# Patient Record
Sex: Female | Born: 1970 | Race: White | Hispanic: No | Marital: Single | Smoking: Never smoker
Health system: Southern US, Community
[De-identification: ages and names within clinical notes are randomized; demographics above are authoritative.]

## PROBLEM LIST (undated history)

## (undated) DIAGNOSIS — F649 Gender identity disorder, unspecified: Secondary | ICD-10-CM

## (undated) DIAGNOSIS — E079 Disorder of thyroid, unspecified: Secondary | ICD-10-CM

## (undated) DIAGNOSIS — J45909 Unspecified asthma, uncomplicated: Secondary | ICD-10-CM

## (undated) DIAGNOSIS — F329 Major depressive disorder, single episode, unspecified: Secondary | ICD-10-CM

## (undated) DIAGNOSIS — F32A Depression, unspecified: Secondary | ICD-10-CM

## (undated) HISTORY — DX: Depression, unspecified: F32.A

## (undated) HISTORY — PX: TONSILLECTOMY: SUR1361

## (undated) HISTORY — DX: Major depressive disorder, single episode, unspecified: F32.9

---

## 2014-06-29 ENCOUNTER — Encounter (HOSPITAL_BASED_OUTPATIENT_CLINIC_OR_DEPARTMENT_OTHER): Payer: Self-pay | Admitting: Emergency Medicine

## 2014-06-29 ENCOUNTER — Emergency Department (HOSPITAL_BASED_OUTPATIENT_CLINIC_OR_DEPARTMENT_OTHER): Payer: No Typology Code available for payment source

## 2014-06-29 ENCOUNTER — Emergency Department (HOSPITAL_BASED_OUTPATIENT_CLINIC_OR_DEPARTMENT_OTHER)
Admission: EM | Admit: 2014-06-29 | Discharge: 2014-06-29 | Disposition: A | Discharge status: Home / Self Care | Payer: No Typology Code available for payment source | Attending: Emergency Medicine | Admitting: Emergency Medicine

## 2014-06-29 DIAGNOSIS — J45909 Unspecified asthma, uncomplicated: Secondary | ICD-10-CM | POA: Diagnosis not present

## 2014-06-29 DIAGNOSIS — S39011A Strain of muscle, fascia and tendon of abdomen, initial encounter: Secondary | ICD-10-CM | POA: Insufficient documentation

## 2014-06-29 DIAGNOSIS — Z8659 Personal history of other mental and behavioral disorders: Secondary | ICD-10-CM | POA: Insufficient documentation

## 2014-06-29 DIAGNOSIS — Y939 Activity, unspecified: Secondary | ICD-10-CM | POA: Diagnosis not present

## 2014-06-29 DIAGNOSIS — X58XXXA Exposure to other specified factors, initial encounter: Secondary | ICD-10-CM | POA: Diagnosis not present

## 2014-06-29 DIAGNOSIS — Z79899 Other long term (current) drug therapy: Secondary | ICD-10-CM | POA: Insufficient documentation

## 2014-06-29 DIAGNOSIS — E079 Disorder of thyroid, unspecified: Secondary | ICD-10-CM | POA: Insufficient documentation

## 2014-06-29 DIAGNOSIS — N50819 Testicular pain, unspecified: Secondary | ICD-10-CM

## 2014-06-29 DIAGNOSIS — Y929 Unspecified place or not applicable: Secondary | ICD-10-CM | POA: Diagnosis not present

## 2014-06-29 DIAGNOSIS — R102 Pelvic and perineal pain: Secondary | ICD-10-CM | POA: Diagnosis present

## 2014-06-29 HISTORY — DX: Unspecified asthma, uncomplicated: J45.909

## 2014-06-29 HISTORY — DX: Gender identity disorder, unspecified: F64.9

## 2014-06-29 HISTORY — DX: Disorder of thyroid, unspecified: E07.9

## 2014-06-29 LAB — URINALYSIS, ROUTINE W REFLEX MICROSCOPIC
Bilirubin Urine: NEGATIVE
Glucose, UA: NEGATIVE mg/dL
Hgb urine dipstick: NEGATIVE
Ketones, ur: NEGATIVE mg/dL
Leukocytes, UA: NEGATIVE
Nitrite: NEGATIVE
Protein, ur: NEGATIVE mg/dL
Specific Gravity, Urine: 1.025 (ref 1.005–1.030)
Urobilinogen, UA: 0.2 mg/dL (ref 0.0–1.0)
pH: 6 (ref 5.0–8.0)

## 2014-06-29 MED ORDER — HYDROCODONE-ACETAMINOPHEN 5-325 MG PO TABS: 1.0000 | ORAL_TABLET | ORAL | Status: DC | PRN

## 2014-06-29 MED ORDER — CIPROFLOXACIN HCL 500 MG PO TABS: 500.0000 mg | ORAL_TABLET | Freq: Two times a day (BID) | ORAL | Status: DC

## 2014-06-29 NOTE — Discharge Instructions (Signed)
Groin Strain A groin strain (also called a groin pull) is an injury to the muscles or tendon on the upper inner part of the thigh. These muscles are called the adductor muscles or groin muscles. They are responsible for moving the leg across the body. A muscle strain occurs when a muscle is overstretched and some muscle fibers are torn. A groin strain can range from mild to severe depending on how many muscle fibers are affected and whether the muscle fibers are partially or completely torn.  Groin strains usually occur during exercise or participation in sports. The injury often happens when a sudden, violent force is placed on a muscle, stretching the muscle too far. A strain is more likely to occur when your muscles are not warmed up or if you are not properly conditioned. Depending on the severity of the groin strain, recovery time may vary from a few weeks to several weeks. Severe injuries often require 4-6 weeks for recovery. In these cases, complete healing can take 4-5 months.  CAUSES   Stretching the groin muscles too far or too suddenly, often during side-to-side motion with an abrupt change in direction.  Putting repeated stress on the groin muscles over a long period of time.  Performing vigorous activity without properly stretching the groin muscles beforehand. SYMPTOMS   Pain and tenderness in the groin area. This begins as sharp pain and persists as a dull ache.  Popping or snapping feeling when the injury occurs (for severe strains).  Swelling or bruising.  Muscle spasms.  Weakness in the leg.  Stiffness in the groin area with decreased ability to move the affected muscles. DIAGNOSIS  Your caregiver will perform a physical exam to diagnose a groin strain. You will be asked about your symptoms and how the injury occurred. X-rays are sometimes needed to rule out a broken bone or cartilage problems. Your caregiver may order a CT scan or MRI if a complete muscle tear is  suspected. TREATMENT  A groin strain will often heal on its own. Your caregiver may prescribe medicines to help manage pain and swelling (anti-inflammatory medicine). You may be told to use crutches for the first few days to minimize your pain. HOME CARE INSTRUCTIONS   Rest. Do not use the strained muscle if it causes pain.  Put ice on the injured area.  Put ice in a plastic bag.  Place a towel between your skin and the bag.  Leave the ice on for 15-20 minutes, every 2-3 hours. Do this for the first 2 days after the injury.  Only take over-the-counter or prescription medicines as directed by your caregiver.  Wrap the injured area with an elastic bandage as directed by your caregiver.  Keep the injured leg raised (elevated).  Walk, stretch, and perform range-of-motion exercises to improve blood flow to the injured area. Only perform these activities if you can do so without any pain. To prevent muscle strains:  Warm up before exercise.  Develop proper conditioning and strength in the groin muscles. SEEK IMMEDIATE MEDICAL CARE IF:   You have increased pain or swelling in the affected area.   Your symptoms are not improving or are getting worse. MAKE SURE YOU:   Understand these instructions.  Will watch your condition.  Will get help right away if you are not doing well or get worse. Document Released: 04/11/2004 Document Revised: 07/31/2012 Document Reviewed: 04/17/2012 [facility] Patient Information 2015 [place], [place]. This information is not intended to replace advice given to you  by your health care provider. Make sure you discuss any questions you have with your health care provider.

## 2014-06-29 NOTE — ED Notes (Signed)
Pt started feeling penile/ perineal pain at 1pm today.  "It felt like I was sitting on something."  Pain is getting worse throughout the day.  Pt is in NAD at this time. No nonverbal signs of pain.  Walking normally. Denies any actual testicular pain.  Sts UC sent pt here for [place].

## 2014-06-29 NOTE — ED Provider Notes (Signed)
CSN: [id number]     Arrival date & time 06/29/14  1954 History   This chart was scribed for Jade Richardson, by Tula Nakayama, ED Scribe. This patient was seen in room MH11/MH11 and the patient's care was started at 8:40 PM.    Chief Complaint  Patient presents with  . Perineal pain    The history is provided by the patient. No language interpreter was used.    HPI Comments: Jade Richardson is a 43 y.o. female who is currently undergoing a sex change and presents to the Emergency Department complaining of constant, gradually worsening testicular pain that started earlier today. Pt states pain is center, deep and related to muscles that hold back urine. She denies dysuria, difficulty urinating, hematuria and abdominal pain as associated symptoms.   Past Medical History  Diagnosis Date  . Gender dysphoria   . Thyroid disease   . Asthma    History reviewed. No pertinent past surgical history. No family history on file. History  Substance Use Topics  . Smoking status: Never Smoker   . Smokeless tobacco: Not on file  . Alcohol Use: Yes     Comment: rarely   OB History    No data available     Review of Systems  Gastrointestinal: Negative for abdominal pain.  Genitourinary: Negative for dysuria, hematuria and difficulty urinating.       Deep, centered testicular pain  Musculoskeletal: Positive for myalgias.  All other systems reviewed and are negative.     Allergies  Review of patient's allergies indicates no known allergies.  Home Medications   Prior to Admission medications   Medication Sig Start Date End Date Taking? Authorizing Provider  estradiol (ESTRACE) 2 MG tablet Take 2 mg by mouth 2 (two) times daily.   Yes Historical Provider, MD  levothyroxine (SYNTHROID, LEVOTHROID) 150 MCG tablet Take 150 mcg by mouth daily before breakfast.   Yes Historical Provider, MD  spironolactone (ALDACTONE) 25 MG tablet Take 100 mg by mouth 2 (two) times daily.   Yes  Historical Provider, MD   BP 123/87 mmHg  Pulse 91  Temp(Src) 98.1 F (36.7 C) (Oral)  Resp 16  Ht 5' 7"  (1.702 m)  Wt 202 lb (91.627 kg)  BMI 31.63 kg/m2  SpO2 99% Physical Exam  Constitutional: She is oriented to person, place, and time. She appears well-developed and well-nourished. No distress.  HENT:  Head: Normocephalic and atraumatic.  Right Ear: Hearing normal.  Left Ear: Hearing normal.  Nose: Nose normal.  Mouth/Throat: Oropharynx is clear and moist and mucous membranes are normal.  Eyes: Conjunctivae and EOM are normal. Pupils are equal, round, and reactive to light.  Neck: Normal range of motion. Neck supple.  Cardiovascular: Regular rhythm, S1 normal and S2 normal.  Exam reveals no gallop and no friction rub.   No murmur heard. Pulmonary/Chest: Effort normal and breath sounds normal. No respiratory distress. She exhibits no tenderness.  Abdominal: Soft. Normal appearance and bowel sounds are normal. There is no hepatosplenomegaly. There is no tenderness. There is no rebound, no guarding, no tenderness at McBurney's point and negative Murphy's sign. No hernia.  Musculoskeletal: Normal range of motion.  Neurological: She is alert and oriented to person, place, and time. She has normal strength. No cranial nerve deficit or sensory deficit. Coordination normal. GCS eye subscore is 4. GCS verbal subscore is 5. GCS motor subscore is 6.  Skin: Skin is warm, dry and intact. No rash noted. No cyanosis.  Psychiatric: She  has a normal mood and affect. Her speech is normal and behavior is normal. Thought content normal.  Nursing note and vitals reviewed.   ED Course  Procedures (including critical care time) DIAGNOSTIC STUDIES: Oxygen Saturation is 99% on RA, normal by my interpretation.    COORDINATION OF CARE: 8:41 PM Discussed treatment plan with pt at bedside and pt agreed to plan.  Labs Review Labs Reviewed  URINALYSIS, ROUTINE W REFLEX MICROSCOPIC    Imaging  Review No results found.   EKG Interpretation None      MDM   Final diagnoses:  Testicle pain    Patient presents to the ER her evaluation of pain in the low abdomen and groin area. Patient reports that he has had some pain with movement. He was referred to the ER to rule out testicular torsion. This was performed and was negative. I also performed a rectal exam, he does not have an enlarged or tender prostate. Urinalysis was unremarkable.  Symptoms most likely secondary to abdominal strain. Treat with analgesia. Empiric cipro.   I personally performed the services described in this documentation, which was scribed in my presence. The recorded information has been reviewed and is accurate.      Jade Greek, MD 06/29/14 2322

## 2015-12-12 IMAGING — US US ART/VEN ABD/PELV/SCROTUM DOPPLER LTD
1 series · 13 of 25 positions shown · non-contrast
Comparison: None.

CLINICAL DATA: Pain in scrotum for 9 hr. Only hurts with full
bladder.

EXAM:
DOPPLER ULTRASOUND OF THE TESTICLES
TECHNIQUE: Color and spectral Doppler ultrasound were utilized to evaluate
blood flow to the testicles.

[Series 1: us art/ven abd/pelv/scrotum doppler ltd · 0.07mm/px · 13 of 29 slices shown]
[im 1/29]
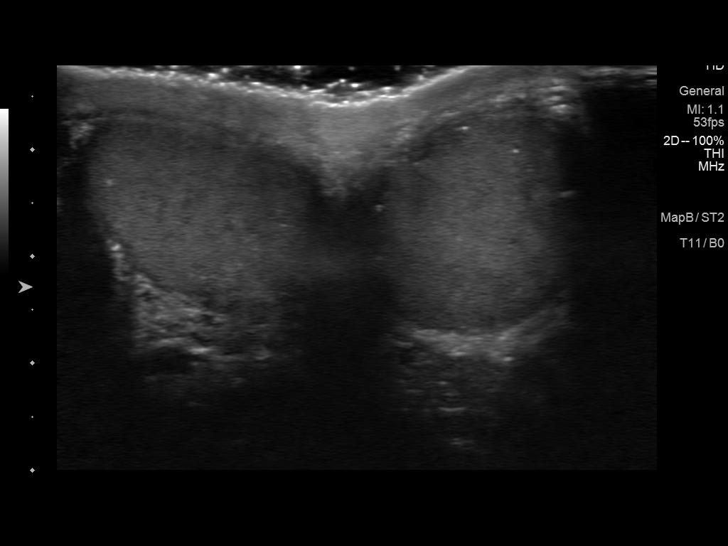
[im 3/29]
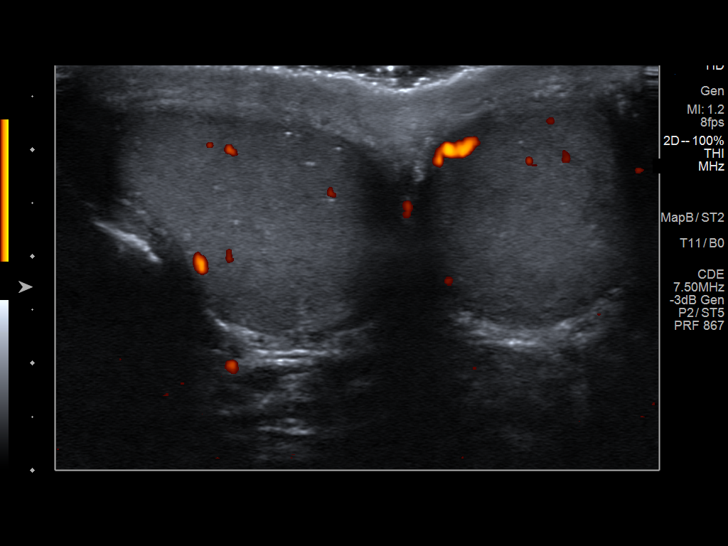
[im 5/29]
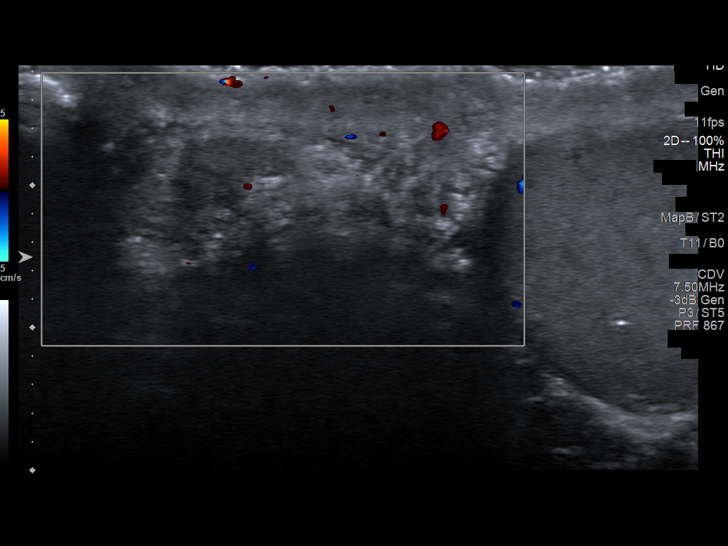
[im 8/29]
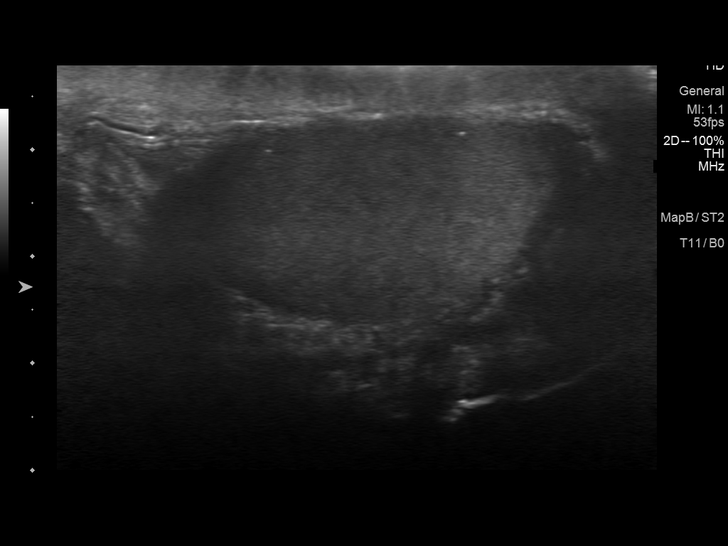
[im 10/29]
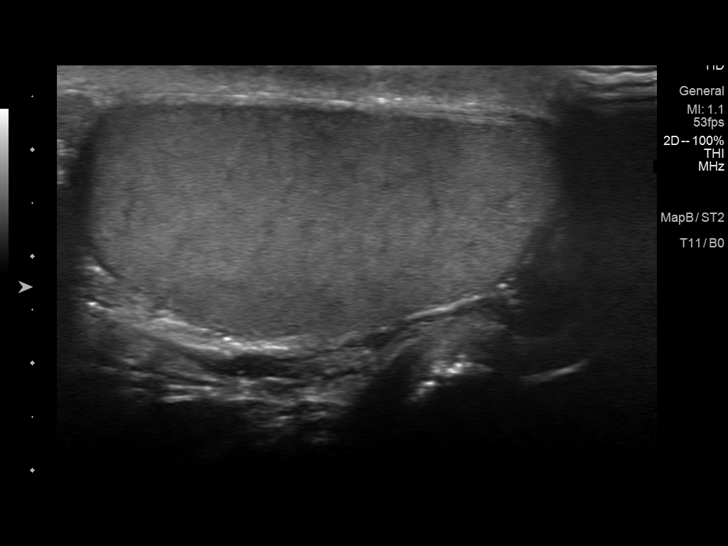
[im 12/29]
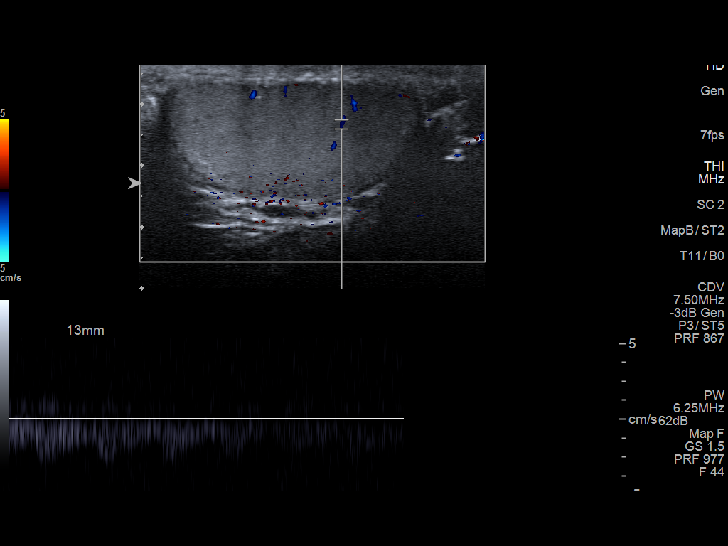
[im 15/29]
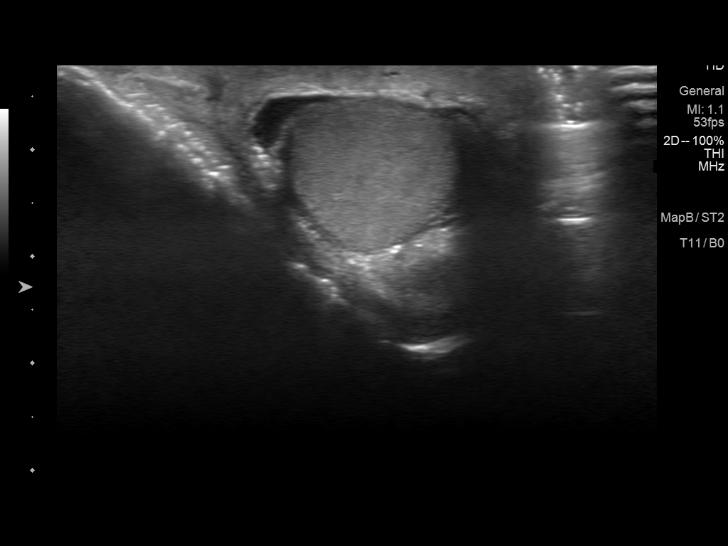
[im 17/29]
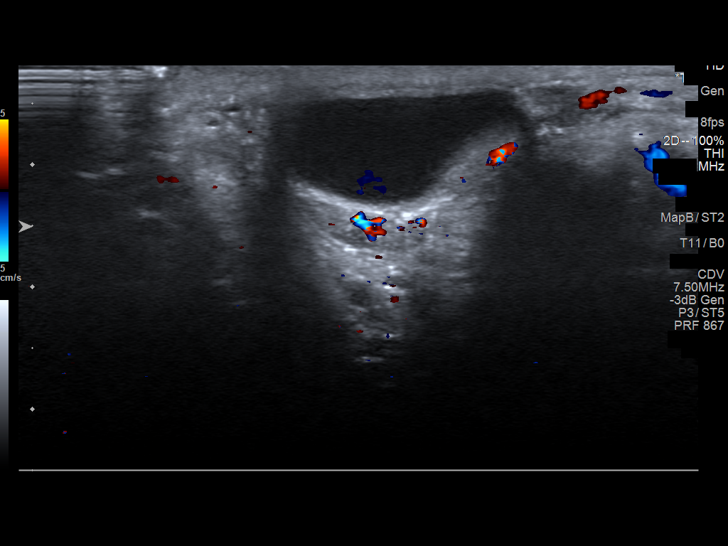
[im 19/29]
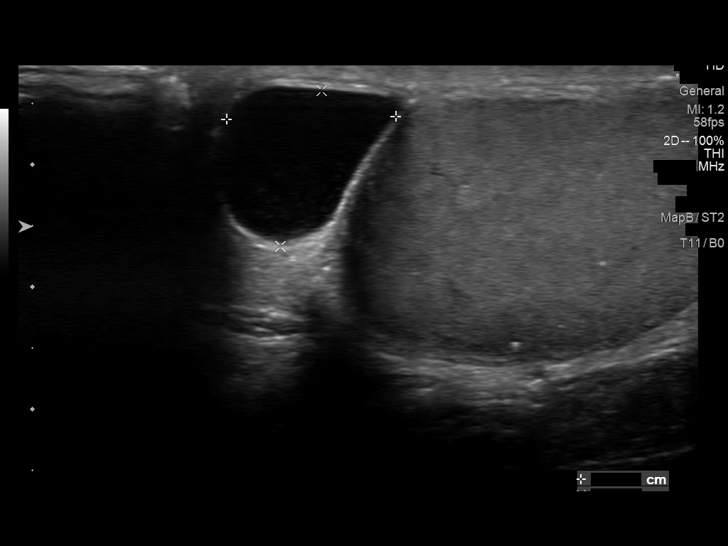
[im 22/29]
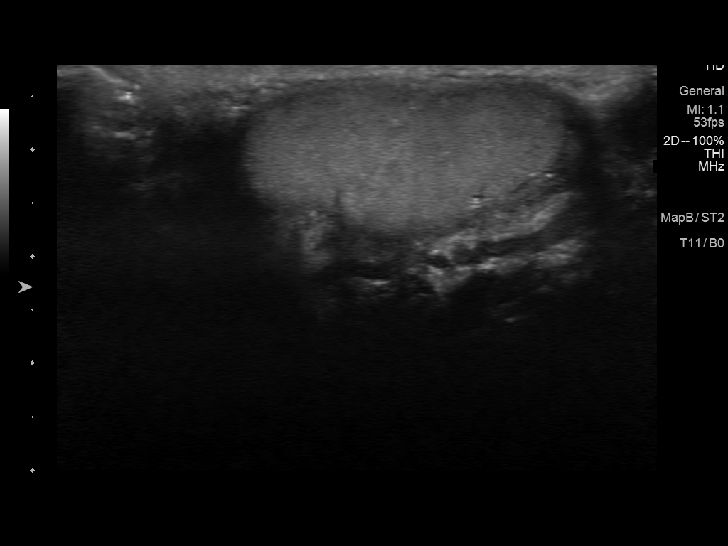
[im 24/29]
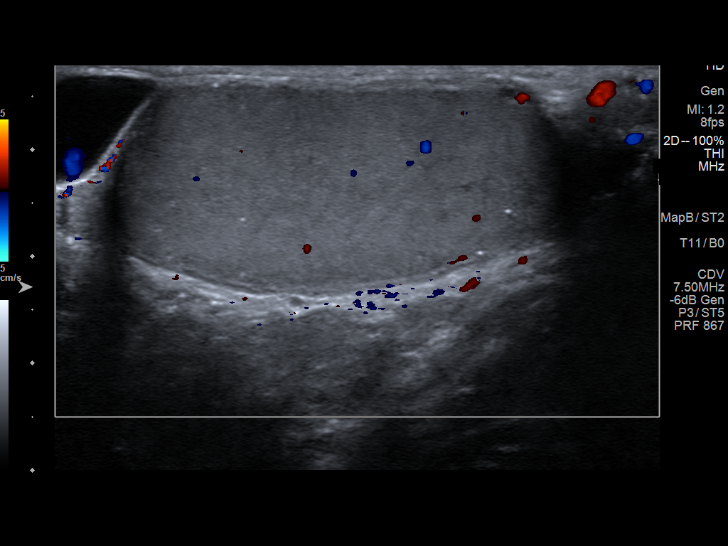
[im 26/29]
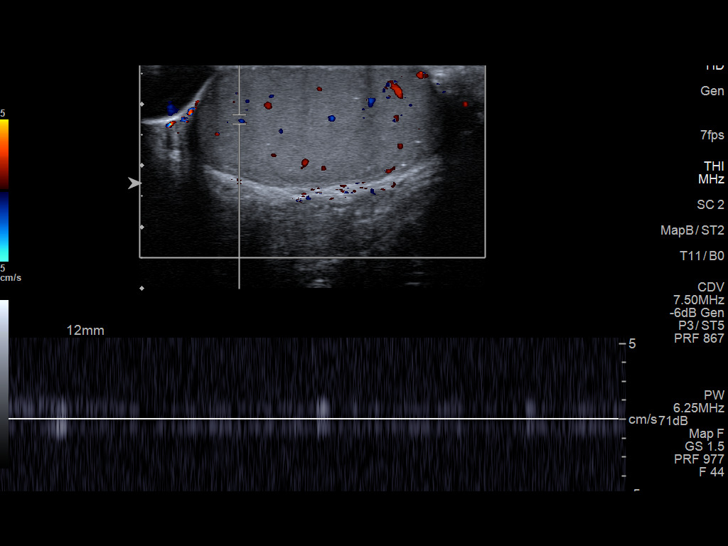
[im 29/29]
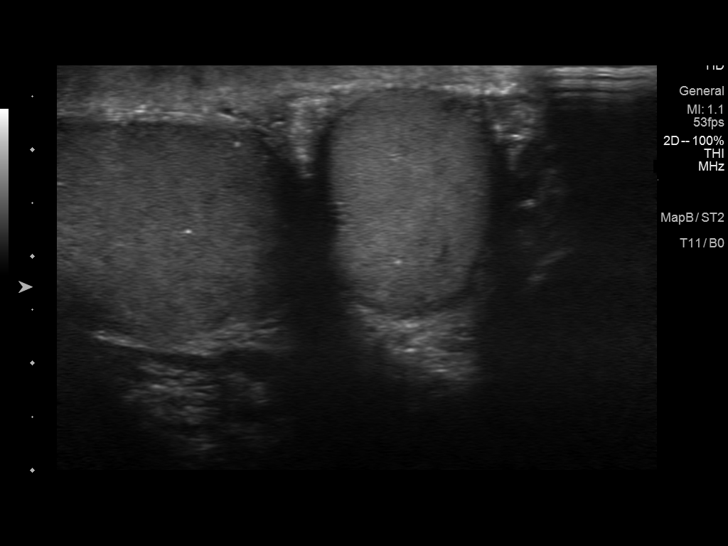

[13 of 25 positions shown; findings below may reference images not displayed]

FINDINGS: Right testicle

Measurements: 4.6 x 2.3 x 2.5 cm. Normal homogeneous parenchymal
echotexture. Multiple tiny echogenic foci scattered throughout the
testicle consistent with microlithiasis. No mass lesions identified.
Homogeneous flow on color flow Doppler imaging.

Left testicle

Measurements: 4.3 x 2 x 2.5 cm. Normal homogeneous parenchymal
echotexture. Multiple tiny echogenic foci scattered throughout the
testicle consistent with microlithiasis. No mass lesions identified.
Homogeneous flow on color flow Doppler imaging.

Right epididymis:  Normal in size and appearance.

Left epididymis: Epididymal cyst or spermatocele measuring 1.4 x
x 1.3 cm

Pulsed Doppler interrogation of both testes demonstrates normal
low-resistance arterial and venous waveforms. Normal homogeneous
flow demonstrated on color flow Doppler throughout both testes and
epididymides.
IMPRESSION: No evidence of testicular mass or torsion. Bilateral testicular
microlithiasis. Left epididymal cyst or spermatocele.

## 2016-09-06 LAB — HEPATIC FUNCTION PANEL
ALT: 14 (ref 7–35)
AST: 14 (ref 13–35)
Alkaline Phosphatase: 46 (ref 25–125)
Bilirubin, Total: 0.2

## 2016-09-06 LAB — BASIC METABOLIC PANEL
BUN: 17 (ref 4–21)
Creatinine: 0.7 (ref 0.5–1.1)
Glucose: 105
Potassium: 4.6 (ref 3.4–5.3)
Sodium: 140 (ref 137–147)

## 2016-09-06 LAB — TSH: TSH: 3.17 (ref 0.41–5.90)

## 2017-02-16 ENCOUNTER — Encounter: Payer: Self-pay | Admitting: Family Medicine

## 2017-02-16 ENCOUNTER — Ambulatory Visit (INDEPENDENT_AMBULATORY_CARE_PROVIDER_SITE_OTHER): Payer: Managed Care, Other (non HMO) | Admitting: Family Medicine

## 2017-02-16 VITALS — BP 120/80 | HR 82 | Resp 12 | Ht 67.0 in | Wt 181.0 lb

## 2017-02-16 DIAGNOSIS — Z23 Encounter for immunization: Secondary | ICD-10-CM

## 2017-02-16 DIAGNOSIS — E039 Hypothyroidism, unspecified: Secondary | ICD-10-CM | POA: Diagnosis not present

## 2017-02-16 DIAGNOSIS — R634 Abnormal weight loss: Secondary | ICD-10-CM

## 2017-02-16 DIAGNOSIS — Z114 Encounter for screening for human immunodeficiency virus [HIV]: Secondary | ICD-10-CM | POA: Diagnosis not present

## 2017-02-16 LAB — BASIC METABOLIC PANEL
BUN: 14 mg/dL (ref 6–23)
CO2: 26 mEq/L (ref 19–32)
Calcium: 9.6 mg/dL (ref 8.4–10.5)
Chloride: 105 mEq/L (ref 96–112)
Creatinine, Ser: 0.92 mg/dL (ref 0.40–1.20)
GFR: 70.54 mL/min (ref >60.00)
Glucose, Bld: 95 mg/dL (ref 70–99)
Potassium: 4.5 mEq/L (ref 3.5–5.1)
Sodium: 137 mEq/L (ref 135–145)

## 2017-02-16 LAB — CBC WITH DIFFERENTIAL/PLATELET
Basophils Absolute: 0 10*3/uL (ref 0.0–0.1)
Basophils Relative: 0.7 % (ref 0.0–3.0)
Eosinophils Absolute: 0.1 10*3/uL (ref 0.0–0.7)
Eosinophils Relative: 2 % (ref 0.0–5.0)
HCT: 41.3 % (ref 36.0–46.0)
Hemoglobin: 13.9 g/dL (ref 12.0–15.0)
Lymphocytes Relative: 25.6 % (ref 12.0–46.0)
Lymphs Abs: 1.4 10*3/uL (ref 0.7–4.0)
MCHC: 33.7 g/dL (ref 30.0–36.0)
MCV: 90.9 fl (ref 78.0–100.0)
Monocytes Absolute: 0.7 10*3/uL (ref 0.1–1.0)
Monocytes Relative: 11.7 % (ref 3.0–12.0)
Neutro Abs: 3.4 10*3/uL (ref 1.4–7.7)
Neutrophils Relative %: 60 % (ref 43.0–77.0)
Platelets: 329 10*3/uL (ref 150.0–400.0)
RBC: 4.54 Mil/uL (ref 3.87–5.11)
RDW: 13.6 % (ref 11.5–15.5)
WBC: 5.7 10*3/uL (ref 4.0–10.5)

## 2017-02-16 LAB — HEMOGLOBIN A1C: Hgb A1c MFr Bld: 5.4 % (ref 4.6–6.5)

## 2017-02-16 LAB — TSH: TSH: 3.86 u[IU]/mL (ref 0.35–4.50)

## 2017-02-16 NOTE — Progress Notes (Signed)
HPI:   Jade Richardson is a 46 y.o. female, who is here today to establish care.  Former PCP: N/A Last preventive routine visit: She follows with ger gyn every 6 months, last visit ago. She is hormonal therapy , part of transgender treatment.   Chronic medical problems: Neuro sensorial hearing loss (ENT in 01/2017), hypothyroidism, and depression among some.  She is currently on Levothyroxine 150 mcg daily. She denies diarrhea/constipation,tremors,cold/heat intolerance.  Concerns today:   She is concerned about weight loss.  She is not weighing herself at home but comparing wt in different offices during [place], so she is not sure about amount of pounds she has lost. Her clothes is loose and has decreased one size. She states that for the past few months she has change her diet and has been more active. She has decreased sodas intake and exercising regularly.She assumes these changes may have caused wt loss and she is happy about this but she she wants to be sure it is nothing to be worried about.  Reports Hx of scoliosis. She is staying with her girlfriend for the past month, sleeping on a softer mattress. Having mild achy lower back, usually in the morning when she first gets up. Resolves spontaneously, it doe snot affect her daily activities.  Denies tobacco use but her ex wife was smoker.   Denies abdominal pain, nausea, vomiting, changes in bowel habits, blood in stool or melena.  She is also concerned about STD's risk factors. Specifically she wants to know if oral sex and swallowing semen can increase risk of STD's. She denies anal intercourse. She states that she can have a full erection and so is her girlfriend.  She is not sure about last HIV test.   Review of Systems  Constitutional: Negative for activity change, appetite change, fatigue and fever.  HENT: Negative for mouth sores, nosebleeds, sore throat and trouble swallowing.   Eyes: Negative for redness  and visual disturbance.  Respiratory: Negative for shortness of breath and wheezing.   Cardiovascular: Negative for chest pain, palpitations and leg swelling.  Gastrointestinal: Negative for abdominal pain, blood in stool, nausea and vomiting.       Negative for changes in bowel habits.  Endocrine: Positive for polydipsia. Negative for cold intolerance, heat intolerance, polyphagia and polyuria.  Genitourinary: Negative for decreased urine volume, difficulty urinating, dysuria and hematuria.  Musculoskeletal: Positive for back pain. Negative for gait problem and neck pain.  Skin: Negative for color change and rash.  Allergic/Immunologic: Positive for environmental allergies.  Neurological: Negative for syncope, weakness, numbness and headaches.  Hematological: Negative for adenopathy. Does not bruise/bleed easily.  Psychiatric/Behavioral: Negative for confusion and sleep disturbance. The patient is nervous/anxious.       Current Outpatient Prescriptions on File Prior to Visit  Medication Sig Dispense Refill  . estradiol (ESTRACE) 2 MG tablet Take 2 mg by mouth 2 (two) times daily.    Marland Kitchen levothyroxine (SYNTHROID, LEVOTHROID) 150 MCG tablet Take 150 mcg by mouth daily before breakfast.    . spironolactone (ALDACTONE) 25 MG tablet Take 100 mg by mouth 2 (two) times daily.     No current facility-administered medications on file prior to visit.      Past Medical History:  Diagnosis Date  . Asthma   . Depression   . Gender dysphoria   . Thyroid disease    No Known Allergies  Family History  Problem Relation Age of Onset  . Mental illness Maternal Uncle   .  Cancer Neg Hx   . Diabetes Neg Hx   . Heart disease Neg Hx   . Hypertension Neg Hx     Social History   Social History  . Marital status: Single    Spouse name: N/A  . Number of children: N/A  . Years of education: N/A   Social History Main Topics  . Smoking status: Never Smoker  . Smokeless tobacco: Never Used  .  Alcohol use Yes     Comment: rarely  . Drug use: No  . Sexual activity: Yes    Birth control/ protection: None   Other Topics Concern  . None   Social History Narrative  . None    Vitals:   02/16/17 0959  BP: 120/80  Pulse: 82  Resp: 12   O2 sat at RA 97%. Body mass index is 28.35 kg/m.   Physical Exam  Nursing note and vitals reviewed. Constitutional: She is oriented to person, place, and time. She appears well-developed. No distress.  HENT:  Head: Atraumatic.  Mouth/Throat: Oropharynx is clear and moist. Mucous membranes are dry.  Eyes: Conjunctivae and EOM are normal. Pupils are equal, round, and reactive to light.  Neck: No tracheal deviation present. No thyroid mass and no thyromegaly (palpable) present.  Cardiovascular: Normal rate and regular rhythm.   No murmur heard. Pulses:      Dorsalis pedis pulses are 2+ on the right side, and 2+ on the left side.  Respiratory: Effort normal and breath sounds normal. No respiratory distress.  GI: Soft. She exhibits no mass. There is no hepatomegaly. There is no tenderness.  Musculoskeletal: She exhibits no edema or tenderness.       Lumbar back: She exhibits no tenderness and no bony tenderness.  Lymphadenopathy:    She has no cervical adenopathy.  Neurological: She is alert and oriented to person, place, and time. She has normal strength. Gait normal.  Skin: Skin is warm. No rash noted. No erythema.  Psychiatric: Her mood appears anxious.  Well groomed, good eye contact.     ASSESSMENT AND PLAN:   Graelyn was seen today for establish care.  Diagnoses and all orders for this visit:  Weight loss  Most likely related to life style changes. Reassured, No findings that suggest a serious process today. Further recommendations will be given according to lab results. Recommend monitoring wt at home. F/U in 2 months.  -     Hemoglobin A1c -     Basic metabolic panel -     CBC with  Differential/Platelet  Screening for HIV (human immunodeficiency virus)  We discussed HIV prophylaxis treatment and indications, she denies having anal intercourse.  -     HIV antibody (with reflex)  Hypothyroidism, unspecified type  No changes in current management, will follow labs done today and will give further recommendations accordingly.  -     TSH  Need for Tdap vaccination -     Tdap vaccine greater than or equal to 7yo IM      Adalberto Metzgar G. [name], MD  [facility]. [place] office.

## 2017-02-16 NOTE — Patient Instructions (Signed)
A few things to remember from today's visit:   Screening for HIV (human immunodeficiency virus) - Plan: Hemoglobin A1c, HIV antibody (with reflex)  Weight loss - Plan: Hemoglobin X5T, Basic metabolic panel, CBC with Differential/Platelet  Hypothyroidism, unspecified type - Plan: TSH   We have ordered labs or studies at this visit.  It can take up to 1-2 weeks for results and processing. IF results require follow up or explanation, we will call you with instructions. Clinically stable results will be released to your [facility]. If you have not heard from [place] or cannot find your results in [facility] in 2 weeks please contact our office at [phone].  If you are not yet signed up for [facility], please consider signing up  Please be sure medication list is accurate. If a new problem present, please set up appointment sooner than planned today.

## 2017-02-17 LAB — HIV ANTIBODY (ROUTINE TESTING W REFLEX): HIV 1&2 Ab, 4th Generation: NONREACTIVE

## 2017-03-07 ENCOUNTER — Encounter: Payer: Self-pay | Admitting: Family Medicine

## 2017-04-11 ENCOUNTER — Ambulatory Visit: Payer: Managed Care, Other (non HMO) | Admitting: Family Medicine

## 2017-04-17 NOTE — Progress Notes (Signed)
HPI:  Chief Complaint  Patient presents with  . Anxiety   Jade Richardson is a 46 y.o. female, who is here today c/o anxiety issues.  She was seen on 02/16/17.  She has some anxiety before, mild , and has not needed pharmacologic treatment. She feels like for the past 1.5-2 months anxiety is getting worse. Hx of depression.   She is a transgender female, currently she is on hormonal therapy. She has ben in a stable relation with also a transgender female, she is younger (44 yo). They have been together for about a year.They work in same company but different shifts and different days off, so they do not have a lot of time to spend together. Also her employer does not know about their relation.  She was married before for 7 years with another transgender female.States that in prior relation she was the one who took care of every thing, her ex-wife depended almost 100% on her. 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 Situations at home that make feel like she may have done something wrong "but I know I have not."  States that she knows she loves her but she feels insecure and afraid of not been good enough for her.  She knows she is the one that has a problem, she does "not want to feel this way." She denies depressed mood or suicidal ideation or plan but sometimes that she would be better death so she does not have to deal with this feeling.   She has a counselor she sees weekly and according to pt, she was told she does not need medication  to help with anxiety.   Review of Systems  Constitutional: Positive for fatigue. Negative for activity change, appetite change and unexpected weight change.  HENT: Negative for mouth sores and trouble swallowing.   Respiratory: Negative for chest tightness, shortness of breath and wheezing.   Cardiovascular: Negative for palpitations and leg swelling.  Gastrointestinal: Negative for abdominal pain, diarrhea, nausea and vomiting.  Endocrine: Negative for cold intolerance and heat intolerance.  Musculoskeletal: Negative for gait problem and myalgias.  Neurological: Negative for dizziness, tremors, seizures and headaches.  Psychiatric/Behavioral: Negative for confusion, hallucinations, sleep disturbance and suicidal ideas. The patient is nervous/anxious.     Current Outpatient Prescriptions on File Prior to Visit  Medication Sig Dispense Refill  . estradiol (ESTRACE) 2 MG tablet Take 2 mg by mouth 2 (two) times daily.    Marland Kitchen levothyroxine (SYNTHROID, LEVOTHROID) 150 MCG tablet Take 150 mcg by mouth daily before breakfast.    . medroxyPROGESTERone (PROVERA) 10 MG tablet Take 10 mg by mouth daily.    Marland Kitchen spironolactone (ALDACTONE) 25 MG tablet Take 100 mg by mouth 2 (two) times daily.     No current facility-administered medications on file prior to visit.      Past Medical History:  Diagnosis Date  . Asthma   . Depression   . Gender dysphoria   . Thyroid disease  No Known Allergies  Social History   Social History  . Marital status: Single    Spouse name: N/A  . Number of children: N/A  . Years of education: N/A   Social History Main Topics  . Smoking status: Never Smoker  . Smokeless tobacco: Never Used  . Alcohol use Yes     Comment: rarely  . Drug use: No  . Sexual activity: Yes    Birth control/ protection: None   Other Topics Concern  . None   Social History Narrative  . None    Vitals:   04/18/17 0656  BP: 118/80  Pulse: 83  Resp: 12  SpO2: 98%   Body  mass index is 25.57 kg/m.   Physical Exam  Constitutional: She is oriented to person, place, and time. She appears well-developed. No distress.  HENT:  Mouth/Throat: Oropharynx is clear and moist and mucous membranes are normal.  Eyes: Pupils are equal, round, and reactive to light. Conjunctivae are normal.  Cardiovascular: Normal rate and regular rhythm.   No murmur heard. Respiratory: Effort normal and breath sounds normal. No respiratory distress.  Lymphadenopathy:    She has no cervical adenopathy.  Neurological: She is alert and oriented to person, place, and time. She has normal strength. Coordination and gait normal.  Skin: Skin is warm. No erythema.  Psychiatric: Her mood appears anxious. Her affect is labile. She expresses no suicidal ideation. She expresses no suicidal plans.  Well groomed, good eye contact.     ASSESSMENT AND PLAN:   Jade Richardson was seen today for anxiety.  Diagnoses and all orders for this visit:  Anxiety disorder, unspecified type  We dicussed a few treatment options and side effects. She agrees with trying Fluoxetine, start with 10 mg (1/2 tab) and increase to whole tab. She will continue weekly counseling. F/U in 3-4 weeks,before if needed.  -     FLUoxetine (PROZAC) 20 MG tablet; Take 0.5 tablets (10 mg total) by mouth daily.  Depression, major, single episode, mild (HCC)  Mild. Denies suicidal ideation or plan. Hormonal therapy may also aggravate problem.  Fluoxetine started today. Instructed about warning signs.  -     FLUoxetine (PROZAC) 20 MG tablet; Take 0.5 tablets (10 mg total) by mouth daily.    Depression screen [facility] 2/9 04/18/2017  Decreased Interest 2  Down, Depressed, Hopeless 2  PHQ - 2 Score 4  Altered sleeping 1  Tired, decreased energy 1  Change in appetite 0  Feeling bad or failure about yourself  3  Trouble concentrating 0  Moving slowly or fidgety/restless 0  Suicidal thoughts 1  PHQ-9 Score 10    25 min  face to face OV. > 50% was dedicated to discussion of Dx, treatment options, and some side effects of medications. Encouraged to discuss some of her feeling with her girlfriend, miscommunication or misperception can aggravate problem. Age differences may be playing a role, she also needs deal with challenges of a new relationship.      Harris Penton G. [name], MD  [facility]. [place] office.

## 2017-04-18 ENCOUNTER — Encounter: Payer: Self-pay | Admitting: Family Medicine

## 2017-04-18 ENCOUNTER — Ambulatory Visit (INDEPENDENT_AMBULATORY_CARE_PROVIDER_SITE_OTHER): Payer: Managed Care, Other (non HMO) | Admitting: Family Medicine

## 2017-04-18 DIAGNOSIS — F419 Anxiety disorder, unspecified: Secondary | ICD-10-CM

## 2017-04-18 DIAGNOSIS — F320 Major depressive disorder, single episode, mild: Secondary | ICD-10-CM | POA: Insufficient documentation

## 2017-04-18 MED ORDER — FLUOXETINE HCL 20 MG PO TABS: 10.0000 mg | ORAL_TABLET | Freq: Every day | ORAL | 1 refills | Status: DC

## 2017-04-18 NOTE — Patient Instructions (Signed)
A few things to remember from today's visit:   Anxiety disorder, unspecified type - Plan: FLUoxetine (PROZAC) 20 MG tablet  Today we started Fluoxetine, this type of medications can increase suicidal risk. This is more prevalent among children,adolecents, and young adults with major depression or other psychiatric disorders. It can also make depression worse. Most common side effects are gastrointestinal, self limited after a few weeks: diarrhea, nausea, constipation  Or diarrhea among some.  In general it is well tolerated. We will follow closely.      Please be sure medication list is accurate. If a new problem present, please set up appointment sooner than planned today.

## 2017-04-22 ENCOUNTER — Encounter: Payer: Self-pay | Admitting: Family Medicine

## 2017-05-08 NOTE — Progress Notes (Deleted)
      HPI:   [name] is a 46 y.o. female, who is here today to follow on recent OV.   She was seen on 04/18/17 because worsening anxiety. 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  Last OV she was started on Prozac. She follows with counselor weekly.   Review of Systems    Current Outpatient Prescriptions on File Prior to Visit  Medication Sig Dispense Refill  . estradiol (ESTRACE) 2 MG tablet Take 2 mg by mouth 2 (two) times daily.    Marland Kitchen FLUoxetine (PROZAC) 20 MG tablet Take 0.5 tablets (10 mg total) by mouth daily. 30 tablet 1  . levothyroxine (SYNTHROID, LEVOTHROID) 150 MCG tablet Take 150 mcg by mouth daily before breakfast.    . medroxyPROGESTERone (PROVERA) 10 MG tablet Take 10 mg by mouth daily.    Marland Kitchen spironolactone (ALDACTONE) 25 MG tablet Take 100 mg by mouth 2 (two) times daily.     No current facility-administered medications on file prior to visit.      Past Medical History:  Diagnosis Date  . Asthma   . Depression   . Gender dysphoria   . Thyroid disease    No Known Allergies  Social History   Social History  . Marital status: Single    Spouse name: N/A  . Number of children: N/A  . Years of education: N/A   Social History Main Topics  . Smoking status: Never Smoker  . Smokeless tobacco: Never Used  . Alcohol use Yes     Comment: rarely  . Drug use: No  . Sexual activity: Yes    Birth control/ protection: None   Other Topics Concern  . Not on file   Social History Narrative  . No narrative on file    There were no vitals filed for this visit. There is no height or weight on file to calculate BMI.      Physical Exam    ASSESSMENT AND PLAN:     There are no diagnoses linked to this encounter.             Jade G. [name], MD  [facility]. [place] office.

## 2017-05-09 ENCOUNTER — Ambulatory Visit: Payer: Managed Care, Other (non HMO) | Admitting: Family Medicine

## 2017-05-09 DIAGNOSIS — Z0289 Encounter for other administrative examinations: Secondary | ICD-10-CM

## 2017-05-18 ENCOUNTER — Ambulatory Visit: Payer: Managed Care, Other (non HMO) | Admitting: Family Medicine

## 2017-07-09 ENCOUNTER — Other Ambulatory Visit: Payer: Self-pay | Admitting: *Deleted

## 2017-07-09 DIAGNOSIS — F320 Major depressive disorder, single episode, mild: Secondary | ICD-10-CM

## 2017-07-09 DIAGNOSIS — F419 Anxiety disorder, unspecified: Secondary | ICD-10-CM

## 2017-07-09 MED ORDER — FLUOXETINE HCL 20 MG PO TABS: 10.0000 mg | ORAL_TABLET | Freq: Every day | ORAL | 1 refills | Status: DC

## 2017-07-11 ENCOUNTER — Other Ambulatory Visit: Payer: Self-pay | Admitting: Family Medicine

## 2017-07-11 ENCOUNTER — Telehealth: Payer: Self-pay | Admitting: Family Medicine

## 2017-07-11 DIAGNOSIS — F320 Major depressive disorder, single episode, mild: Secondary | ICD-10-CM

## 2017-07-11 DIAGNOSIS — F419 Anxiety disorder, unspecified: Secondary | ICD-10-CM

## 2017-07-11 MED ORDER — FLUOXETINE HCL 20 MG PO TABS: 20.0000 mg | ORAL_TABLET | Freq: Every day | ORAL | 1 refills | Status: DC

## 2017-07-11 NOTE — Telephone Encounter (Signed)
Copied from [id number]. Topic: General - Other >> Jul 11, 2017  9:17 AM Jade Richardson wrote: Reason for CRM:  patient would like a refill on Prozac until her appointment with [name] on 07-27-17 @ 8:45

## 2017-07-11 NOTE — Telephone Encounter (Signed)
Rx was sent to her pharmacy.  Thanks, BJ

## 2017-07-11 NOTE — Telephone Encounter (Signed)
Pt would like to be called when rx is called in pt states its ok to leave VM on cell if she doesn't answer

## 2017-07-13 ENCOUNTER — Ambulatory Visit: Payer: Managed Care, Other (non HMO) | Admitting: Family Medicine

## 2017-07-16 NOTE — Progress Notes (Signed)
HPI:   [name] is a 46 y.o. adult, who is here today to follow on recent OV.   She was seen on 04/18/17, when she was c/o anxiety and having mild depression.  Fluoxetine 20 mg was recommended. 4-6 weeks follow up was not arranged. She discontinued Fluoxetine 10 mg after a week, afraid of side effects but did not have any. Resumed Fluoxetine 2 weeks ago, 20 mg daily. She has tolerated medication well and feels like it is helping some but not significant.  Last night she had what she calls a panic attack: gradual onset of nervous and feeling overwhelmed, shaking, and heart racing. This "did not last long", a few seconds. She has not identified exacerbating factors. Alleviated by lying down and listening to music. Frequent crying spells and "racing thoughts." She feels like she "is keeping up with life." She denies suicidal thoughts.  Symptoms seem worse in the morning.  She is also following with counselor.  She has not discussed problem with her girlfriend. Problem is not affecting her job.  Hx of hypothyroidism. She is taking Levothyroxine 150 mcg daily.  She has not noted abnormal wt loss,diarrhea,constipation, heat/cold intolerance. TSH 3.8 in 01/2017.  Review of Systems  Constitutional: Negative for activity change, appetite change, fatigue and unexpected weight change.  Respiratory: Negative for chest tightness, shortness of breath and wheezing.   Cardiovascular: Negative for chest pain, palpitations and leg swelling.  Gastrointestinal: Negative for abdominal pain, diarrhea, nausea and vomiting.  Endocrine: Negative for cold intolerance and heat intolerance.  Musculoskeletal: Negative for gait problem and myalgias.  Skin: Negative for rash.  Neurological: Negative for tremors, seizures and headaches.  Psychiatric/Behavioral: Negative for confusion, decreased concentration, hallucinations, self-injury, sleep disturbance and suicidal ideas. The  patient is nervous/anxious.       Current Outpatient Medications on File Prior to Visit  Medication Sig Dispense Refill  . estradiol (ESTRACE) 2 MG tablet Take 2 mg by mouth 2 (two) times daily.    Marland Kitchen levothyroxine (SYNTHROID, LEVOTHROID) 150 MCG tablet Take 150 mcg by mouth daily before breakfast.    . medroxyPROGESTERone (PROVERA) 10 MG tablet Take 10 mg by mouth daily.    Marland Kitchen spironolactone (ALDACTONE) 25 MG tablet Take 100 mg by mouth 2 (two) times daily.     No current facility-administered medications on file prior to visit.      Past Medical History:  Diagnosis Date  . Asthma   . Depression   . Gender dysphoria   . Thyroid disease    No Known Allergies  Social History   Socioeconomic History  . Marital status: Single    Spouse name: None  . Number of children: None  . Years of education: None  . Highest education level: None  Social Needs  . Financial resource strain: None  . Food insecurity - worry: None  . Food insecurity - inability: None  . Transportation needs - medical: None  . Transportation needs - non-medical: None  Occupational History  . None  Tobacco Use  . Smoking status: Never Smoker  . Smokeless tobacco: Never Used  Substance and Sexual Activity  . Alcohol use: Yes    Comment: rarely  . Drug use: No  . Sexual activity: Yes    Birth control/protection: None  Other Topics Concern  . None  Social History Narrative  . None    Vitals:   07/17/17 0718  BP: 120/72  Pulse: 86  Resp: 12  Temp: 98.4  F (36.9 C)  SpO2: 97%   Body mass index is 24.9 kg/m.    Physical Exam  Nursing note and vitals reviewed. Constitutional: She is oriented to person, place, and time. She appears well-developed and well-nourished. No distress.  HENT:  Head: Normocephalic.  Mouth/Throat: Oropharynx is clear and moist and mucous membranes are normal.  Eyes: Conjunctivae are normal. Pupils are equal, round, and reactive to light.  Neck: No tracheal  deviation present. No thyromegaly present.  Cardiovascular: Normal rate and regular rhythm.  No murmur heard. Respiratory: Effort normal and breath sounds normal. No respiratory distress.  Musculoskeletal: She exhibits no edema or tenderness.  Lymphadenopathy:    She has no cervical adenopathy.  Neurological: She is alert and oriented to person, place, and time. She has normal strength. Gait normal.  Skin: Skin is warm. No erythema.  Psychiatric: Her speech is normal. Her mood appears anxious. Cognition and memory are normal. She expresses no suicidal ideation. She expresses no suicidal plans.  Well groomed, poor eye contact.    ASSESSMENT AND PLAN:   Ms. Jade Richardson was seen today for medication dose change.  Diagnoses and all orders for this visit:  Hypothyroidism, unspecified type  No changes in current management, will follow labs done today and will give further recommendations accordingly.  -     TSH  Depression, major, single episode, mild ([place])  She just resumed Fluoxetine 2 weeks ago, so no changes for now. Clearly instructed about warning signs. She will let me know in 4-6 weeks whether or no symptoms have improved. -     FLUoxetine (PROZAC) 20 MG tablet; Take 1 tablet (20 mg total) daily by mouth.  Anxiety disorder, unspecified type  No changes in Fluoxetine for now. She agrees with adding Clonazepam 1/2-1 tab daily as needed,side effects discussed. F/U in 2 months,before if needed.  -     clonazePAM (KLONOPIN) 0.5 MG tablet; Take 1 tablet (0.5 mg total) daily as needed by mouth for anxiety. -     FLUoxetine (PROZAC) 20 MG tablet; Take 1 tablet (20 mg total) daily by mouth.      Jade G. [name], MD  [facility]. [place] office.

## 2017-07-17 ENCOUNTER — Encounter: Payer: Self-pay | Admitting: Family Medicine

## 2017-07-17 ENCOUNTER — Ambulatory Visit (INDEPENDENT_AMBULATORY_CARE_PROVIDER_SITE_OTHER): Payer: Managed Care, Other (non HMO) | Admitting: Family Medicine

## 2017-07-17 VITALS — BP 120/72 | HR 86 | Temp 98.4°F | Resp 12 | Ht 67.0 in | Wt 159.0 lb

## 2017-07-17 DIAGNOSIS — F419 Anxiety disorder, unspecified: Secondary | ICD-10-CM | POA: Diagnosis not present

## 2017-07-17 DIAGNOSIS — E039 Hypothyroidism, unspecified: Secondary | ICD-10-CM

## 2017-07-17 DIAGNOSIS — F320 Major depressive disorder, single episode, mild: Secondary | ICD-10-CM

## 2017-07-17 MED ORDER — CLONAZEPAM 0.5 MG PO TABS: 0.5000 mg | ORAL_TABLET | Freq: Every day | ORAL | 0 refills | Status: DC | PRN

## 2017-07-17 MED ORDER — FLUOXETINE HCL 20 MG PO TABS: 20.0000 mg | ORAL_TABLET | Freq: Every day | ORAL | 1 refills | Status: DC

## 2017-07-17 NOTE — Patient Instructions (Signed)
A few things to remember from today's visit:   Hypothyroidism, unspecified type - Plan: TSH  Depression, major, single episode, mild ([facility]) - Plan: FLUoxetine (PROZAC) 20 MG tablet  Anxiety disorder, unspecified type - Plan: clonazePAM (KLONOPIN) 0.5 MG tablet, FLUoxetine (PROZAC) 20 MG tablet   Please let me know in 4 weeks about symptoms.  Psychotherapy will help.  [place].   Please be sure medication list is accurate. If a new problem present, please set up appointment sooner than planned today.

## 2017-07-24 ENCOUNTER — Other Ambulatory Visit: Payer: Self-pay | Admitting: *Deleted

## 2017-07-24 DIAGNOSIS — F320 Major depressive disorder, single episode, mild: Secondary | ICD-10-CM

## 2017-07-24 DIAGNOSIS — F419 Anxiety disorder, unspecified: Secondary | ICD-10-CM

## 2017-07-24 MED ORDER — FLUOXETINE HCL 20 MG PO TABS: 20.0000 mg | ORAL_TABLET | Freq: Every day | ORAL | 1 refills | Status: DC

## 2017-07-27 ENCOUNTER — Ambulatory Visit: Payer: Managed Care, Other (non HMO) | Admitting: Family Medicine

## 2017-08-14 ENCOUNTER — Telehealth: Payer: Self-pay | Admitting: Family Medicine

## 2017-08-14 NOTE — Telephone Encounter (Unsigned)
Copied from [place]. Topic: General - Other >> Aug 14, 2017  9:54 AM Neva Seat wrote: Status of how she feels: Feeling fine and only needed medication twice - she hasn't had any suicidal thoughts.

## 2017-08-14 NOTE — Telephone Encounter (Unsigned)
Copied from [place]. >> Aug 14, 2017  9:54 AM Neva Seat wrote: Status of how she feels: Feeling fine and only needed medication twice - she hasn't had any suicidal thoughts.

## 2017-08-15 NOTE — Telephone Encounter (Signed)
Noted. BJ

## 2017-09-17 ENCOUNTER — Ambulatory Visit: Payer: Managed Care, Other (non HMO) | Admitting: Family Medicine

## 2017-09-21 ENCOUNTER — Ambulatory Visit: Payer: Managed Care, Other (non HMO) | Admitting: Family Medicine

## 2017-10-30 ENCOUNTER — Telehealth: Payer: Self-pay

## 2017-10-30 NOTE — Telephone Encounter (Signed)
Called and left pt a message asking her to call into the office and make an appt with Dr. Brigitte Pulse - new pt/ Establish Care appt.  Thanks!

## 2017-10-30 NOTE — Telephone Encounter (Signed)
Please contact pt to schedule Establish Care appt with Dr. Brigitte Pulse.    Copied from [place] [phone] [phone]. Topic: General - Other >> Oct 29, 2017 12:54 PM Yvette Rack wrote: Reason for CRM: patient would like to transfer care from Dr [name] to Delman Cheadle at Primary care at [facility]

## 2017-10-30 NOTE — Telephone Encounter (Signed)
Patient will need a New Pt OV type for EST CARE with Brigitte Pulse.

## 2017-11-01 NOTE — Progress Notes (Signed)
HPI:  Chief Complaint  Patient presents with  . Fatigue  . Headache    off and on in one spot in head    Jade Richardson is a 47 y.o. adult, who is here today for follow up.   She was last seen on 07/17/17, when Clonazepam was added for anxiety. She was on Fluoxetine 20 mg daily.  She is no longer taking Fluoxetine, she discontinued a few weeks ago. In general she feels like she is dealing better with "life" and stress.  She states that "once in a while" she feels "emotionally overwhelmed", she has been able to control it without medication.  She did not have side effects from medication.  She still has Clonazepam but has not taken it for "a long time."  She denies depressed mood or suicidal thoughts.  Today she is complaining of an episode she had a week ago, sudden fatigue and feeling like she was going to "pass out."  She was walking at the time symptoms started, she sat in her car and symptoms resolved in 1-2 hours.She has not had any problem since then.  No associated chest pain, palpitations, dyspnea, nausea, vomiting, or focal weakness.  She had mild she has not identified exacerbating or alleviating factors. States that for years and "once in a while" she has right parietal headache.  He states that this has been stable for years and has not had more episodes than usual.   Hypothyroidism, she is on Levothyroxine 150 mcg daily. 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 She is planning on transferring care to Dr.Shaw, who can follow on her chronic medical problems as well as monitor gender transition.  She is on Aldactone 100 mg bid and Estrace 2 mg daily.  Lab Results  Component Value Date   CREATININE 0.92 02/16/2017   BUN 14 02/16/2017   NA 137 02/16/2017   K 4.5 02/16/2017   CL 105 02/16/2017   CO2 26 02/16/2017     Lab Results  Component  Value Date   WBC 5.7 02/16/2017   HGB 13.9 02/16/2017   HCT 41.3 02/16/2017   MCV 90.9 02/16/2017   PLT 329.0 02/16/2017    She is reporting lab work done about a week ago and everything "was fine."   Review of Systems  Constitutional: Negative for activity change, appetite change, diaphoresis and unexpected weight change.  HENT: Negative for mouth sores and sore throat.   Eyes: Negative for redness and visual disturbance.  Respiratory: Negative for shortness of breath and wheezing.   Cardiovascular: Negative for chest pain, palpitations and leg swelling.  Gastrointestinal: Negative for abdominal pain, nausea and vomiting.       No changes in bowel habits.  Endocrine: Negative for cold intolerance and heat intolerance.  Genitourinary: Negative for dysuria and hematuria.  Musculoskeletal: Negative for gait problem and myalgias.  Skin: Negative for rash.  Neurological: Negative for tremors, syncope and headaches.  Hematological: Negative for adenopathy. Does not bruise/bleed easily.  Psychiatric/Behavioral: Negative for confusion, hallucinations, sleep disturbance and suicidal ideas. The patient is nervous/anxious.     Current Outpatient Medications on File Prior to Visit  Medication Sig Dispense Refill  . estradiol (ESTRACE) 2 MG tablet Take 2 mg by mouth 2 (two) times daily.    Marland Kitchen levothyroxine (SYNTHROID, LEVOTHROID) 150 MCG tablet Take 150 mcg by mouth daily before  breakfast.    . spironolactone (ALDACTONE) 25 MG tablet Take 100 mg by mouth 2 (two) times daily.     No current facility-administered medications on file prior to visit.      Past Medical History:  Diagnosis Date  . Asthma   . Depression   . Gender dysphoria   . Thyroid disease    No Known Allergies  Social History   Socioeconomic History  . Marital status: Single    Spouse name: None  . Number of children: None  . Years of education: None  . Highest education level: None  Social Needs  . Financial  resource strain: None  . Food insecurity - worry: None  . Food insecurity - inability: None  . Transportation needs - medical: None  . Transportation needs - non-medical: None  Occupational History  . None  Tobacco Use  . Smoking status: Never Smoker  . Smokeless tobacco: Never Used  Substance and Sexual Activity  . Alcohol use: Yes    Comment: rarely  . Drug use: No  . Sexual activity: Yes    Birth control/protection: None  Other Topics Concern  . None  Social History Narrative  . None    Vitals:   11/02/17 0710  BP: 122/70  Pulse: 88  Resp: 12  Temp: 98.1 F (36.7 C)  SpO2: 98%   Body mass index is 26.39 kg/m.   Physical Exam  Nursing note and vitals reviewed. Constitutional: She is oriented to person, place, and time. She appears well-developed and well-nourished. No distress.  HENT:  Head: Normocephalic and atraumatic.  Mouth/Throat: Oropharynx is clear and moist and mucous membranes are normal.  Eyes: Conjunctivae are normal. Pupils are equal, round, and reactive to light.  Neck: No tracheal deviation present. No thyromegaly present.  Cardiovascular: Normal rate and regular rhythm.  No murmur heard. Pulses:      Dorsalis pedis pulses are 2+ on the right side, and 2+ on the left side.  Respiratory: Effort normal and breath sounds normal. No respiratory distress.  GI: Soft. She exhibits no mass. There is no hepatomegaly. There is no tenderness.  Musculoskeletal: She exhibits no edema or tenderness.  Lymphadenopathy:    She has no cervical adenopathy.  Neurological: She is alert and oriented to person, place, and time. She has normal strength. No cranial nerve deficit. Gait normal.  Reflex Scores:      Patellar reflexes are 2+ on the right side and 2+ on the left side. Skin: Skin is warm. No rash noted. No erythema.  Psychiatric: She has a normal mood and affect.  Well groomed, good eye contact.     ASSESSMENT AND PLAN:   Jade Richardson was  seen today for follow-up.  No orders of the defined types were placed in this encounter.  Pre-syncope  We discussed possible etiologies, including medication side effects, dehydration, anxiety among some. She had one single episode with no residual symptoms. Since she has not had any problem after presyncopal episode and reporting normal lab work done recently, I do not think further workup is needed at this time. Instructed about warning signs.   Headache, unspecified headache type  Problem seems chronic, reporting headache to be unchanged. Neurologic examination today negative. ?  Tension headache. I do not think brain imaging is necessary at this time. Adequate hydration encouraged. Instructed about warning signs.   Depression, major, single episode, mild ([place]) She discontinued Fluoxetine and does not feel like she needs medication at this  time. Instructed about warning signs.  Anxiety disorder, unspecified She will continue Clonazepam 0.5 mg daily as needed. Currently she is also doing psychotherapy. She will continue following with new PCP, Dr Brigitte Pulse        Mckennon Zwart G. [name], MD  [facility]. [place] office.

## 2017-11-02 ENCOUNTER — Ambulatory Visit (INDEPENDENT_AMBULATORY_CARE_PROVIDER_SITE_OTHER): Payer: Managed Care, Other (non HMO) | Admitting: Family Medicine

## 2017-11-02 ENCOUNTER — Encounter: Payer: Self-pay | Admitting: Family Medicine

## 2017-11-02 VITALS — BP 122/70 | HR 88 | Temp 98.1°F | Resp 12 | Ht 67.0 in | Wt 168.5 lb

## 2017-11-02 DIAGNOSIS — F320 Major depressive disorder, single episode, mild: Secondary | ICD-10-CM

## 2017-11-02 DIAGNOSIS — R519 Headache, unspecified: Secondary | ICD-10-CM

## 2017-11-02 DIAGNOSIS — F419 Anxiety disorder, unspecified: Secondary | ICD-10-CM

## 2017-11-02 DIAGNOSIS — R51 Headache: Secondary | ICD-10-CM

## 2017-11-02 DIAGNOSIS — R55 Syncope and collapse: Secondary | ICD-10-CM | POA: Diagnosis not present

## 2017-11-02 NOTE — Assessment & Plan Note (Signed)
She discontinued Fluoxetine and does not feel like she needs medication at this time. Instructed about warning signs.

## 2017-11-02 NOTE — Assessment & Plan Note (Signed)
She will continue Clonazepam 0.5 mg daily as needed. Currently she is also doing psychotherapy. She will continue following with new PCP, Dr Brigitte Pulse

## 2017-11-02 NOTE — Patient Instructions (Addendum)
A few things to remember from today's visit:   Pre-syncope  Depression, major, single episode, mild ([facility])  Anxiety disorder, unspecified type  ? Dehydration. Since symptoms resolved I do not think labs are needed today.   Please be sure medication list is accurate. If a new problem present, please set up appointment sooner than planned today.

## 2017-11-22 ENCOUNTER — Ambulatory Visit: Payer: Self-pay | Admitting: Family Medicine

## 2017-11-29 ENCOUNTER — Encounter: Payer: Self-pay | Admitting: Family Medicine

## 2017-11-29 ENCOUNTER — Ambulatory Visit (INDEPENDENT_AMBULATORY_CARE_PROVIDER_SITE_OTHER): Payer: Managed Care, Other (non HMO) | Admitting: Family Medicine

## 2017-11-29 VITALS — BP 113/72 | HR 74 | Temp 97.8°F | Resp 16 | Ht 68.0 in | Wt 168.6 lb

## 2017-11-29 DIAGNOSIS — Z5181 Encounter for therapeutic drug level monitoring: Secondary | ICD-10-CM

## 2017-11-29 DIAGNOSIS — Z789 Other specified health status: Secondary | ICD-10-CM

## 2017-11-29 DIAGNOSIS — F640 Transsexualism: Secondary | ICD-10-CM | POA: Diagnosis not present

## 2017-11-29 DIAGNOSIS — E038 Other specified hypothyroidism: Secondary | ICD-10-CM | POA: Diagnosis not present

## 2017-11-29 MED ORDER — SPIRONOLACTONE 100 MG PO TABS: 200.0000 mg | ORAL_TABLET | Freq: Every day | ORAL | 1 refills | Status: DC

## 2017-11-29 MED ORDER — LEVOTHYROXINE SODIUM 150 MCG PO TABS: 150.0000 ug | ORAL_TABLET | Freq: Every day | ORAL | 1 refills | Status: DC

## 2017-11-29 MED ORDER — ESTRADIOL 2 MG PO TABS: 6.0000 mg | ORAL_TABLET | Freq: Every day | ORAL | 1 refills | Status: DC

## 2017-11-29 NOTE — Progress Notes (Signed)
Subjective:  By signing my name below, I, Jade Richardson, attest that this documentation has been prepared under the direction and in the presence of Jade Cheadle, MD Electronically Signed: Ladene Artist, ED Scribe 11/29/2017 at 10:36 AM.   Patient ID: Jade Richardson, adult    DOB: Jul 26, 1971, 47 y.o.   MRN: [id number]  Chief Complaint  Patient presents with  . Establish Care  . Medication Refill    Estradiol   HPI Jade Richardson is a 47 y.o. adult who presents to Primary Care at [facility] to establish care. Previous pt of Jade Richardson and Jade Richardson. Last visit with Jade Richardson was last month for f/u on depression and anxiety; stopped meds several months ago. Pt has been on hormone therapy for ~10 yrs which she states is fine but would like larger breasts. She has been on 2 mg estradiol tid and 2 tabs spironolactone qd. Stopped progesterone after 1 yr since she did not notice much difference. She has been on current regimen for over 1 yr. Reports recent, very minimal and rare lightheadedness that she rates 1/10. Denies dizziness, nausea.  Hypothyroidism Pt reports that she was diagnosed with thyroid disease over 30 yrs ago and has been on medication since. Denies fatigue, difficulty sleeping, changes in bowels, weight, skin or hair, heat/cold intolerance.   Current Outpatient Medications on File Prior to Visit  Medication Sig Dispense Refill  . estradiol (ESTRACE) 2 MG tablet Take 2 mg by mouth 2 (two) times daily.    Marland Kitchen levothyroxine (SYNTHROID, LEVOTHROID) 150 MCG tablet Take 150 mcg by mouth daily before breakfast.    . spironolactone (ALDACTONE) 25 MG tablet Take 100 mg by mouth 2 (two) times daily.     No current facility-administered medications on file prior to visit.    Past Medical History:  Diagnosis Date  . Asthma   . Depression   . Gender dysphoria   . Thyroid disease    Past Surgical History:  Procedure Laterality Date  . TONSILLECTOMY     No current  outpatient medications on file prior to visit.   No current facility-administered medications on file prior to visit.    No Known Allergies Family History  Problem Relation Age of Onset  . Mental illness Maternal Uncle   . Cancer Neg Hx   . Diabetes Neg Hx   . Heart disease Neg Hx   . Hypertension Neg Hx    Social History   Socioeconomic History  . Marital status: Single    Spouse name: Not on file  . Number of children: Not on file  . Years of education: Not on file  . Highest education level: Not on file  Occupational History  . Not on file  Social Needs  . Financial resource strain: Not on file  . Food insecurity:    Worry: Not on file    Inability: Not on file  . Transportation needs:    Medical: Not on file    Non-medical: Not on file  Tobacco Use  . Smoking status: Never Smoker  . Smokeless tobacco: Never Used  Substance and Sexual Activity  . Alcohol use: Yes    Comment: rarely  . Drug use: No  . Sexual activity: Yes    Birth control/protection: None  Lifestyle  . Physical activity:    Days per week: Not on file    Minutes per session: Not on file  . Stress: Not on file  Relationships  . Social connections:  Talks on phone: Not on file    Gets together: Not on file    Attends religious service: Not on file    Active member of club or organization: Not on file    Attends meetings of clubs or organizations: Not on file    Relationship status: Not on file  Other Topics Concern  . Not on file  Social History Narrative  . Not on file   Depression screen [facility] 2/9 11/29/2017 04/18/2017  Decreased Interest 0 2  Down, Depressed, Hopeless 0 2  PHQ - 2 Score 0 4  Altered sleeping - 1  Tired, decreased energy - 1  Change in appetite - 0  Feeling bad or failure about yourself  - 3  Trouble concentrating - 0  Moving slowly or fidgety/restless - 0  Suicidal thoughts - 1  PHQ-9 Score - 10      Review of Systems  Constitutional: Negative for fatigue and  unexpected weight change.  Gastrointestinal: Negative for constipation, diarrhea and nausea.  Endocrine: Negative for cold intolerance and heat intolerance.  Skin: Negative.   Neurological: Positive for light-headedness (very rare and minimal). Negative for dizziness.  Psychiatric/Behavioral: Negative for sleep disturbance.      Objective:   Physical Exam  Constitutional: She is oriented to person, place, and time. She appears well-developed and well-nourished. No distress.  HENT:  Head: Normocephalic and atraumatic.  Right Ear: External ear normal.  Left Ear: External ear normal.  Eyes: Conjunctivae and EOM are normal. No scleral icterus.  Neck: Normal range of motion. Neck supple. No tracheal deviation present. No thyromegaly present.  Cardiovascular: Normal rate, regular rhythm, normal heart sounds and intact distal pulses.  Pulmonary/Chest: Effort normal and breath sounds normal. No respiratory distress.  Musculoskeletal: Normal range of motion. She exhibits no edema.  Lymphadenopathy:    She has no cervical adenopathy.  Neurological: She is alert and oriented to person, place, and time.  Skin: Skin is warm and dry. She is not diaphoretic. No erythema.  Psychiatric: She has a normal mood and affect. Her behavior is normal.  Nursing note and vitals reviewed.  BP 113/72   Pulse 74   Temp 97.8 F (36.6 C) (Oral)   Resp 16   Ht 5' 8"  (1.727 m)   Wt 168 lb 9.6 oz (76.5 kg)   SpO2 100%   BMI 25.64 kg/m     Assessment & Plan:   1. Other specified hypothyroidism   2. Female-to-female transgender person   3. Medication monitoring encounter    Jade Richardson reports she has been on hormone therapy x 10 yrs w/ doses stable for many years and she just recently had  blood work down at Dr. Angelica Richardson office that was normal.  She will sign a release so we can have a copy faxed over to me. If it is normal, than ok to refill meds in another 6 mos for 6 mos and f/u in 1 yr from last labs for her  CPE. If we do not get a copy of her recent labs, then f/u in 6 mos for CPE.  Meds ordered this encounter  Medications  . estradiol (ESTRACE) 2 MG tablet    Sig: Take 3 tablets (6 mg total) by mouth daily.    Dispense:  270 tablet    Refill:  1  . levothyroxine (SYNTHROID, LEVOTHROID) 150 MCG tablet    Sig: Take 1 tablet (150 mcg total) by mouth daily before breakfast.    Dispense:  90  tablet    Refill:  1    Please hold on file until pt requests  . spironolactone (ALDACTONE) 100 MG tablet    Sig: Take 2 tablets (200 mg total) by mouth daily.    Dispense:  180 tablet    Refill:  1    Please hold on file until pt requests    I personally performed the services described in this documentation, which was scribed in my presence. The recorded information has been reviewed and considered, and addended by me as needed.   Jade Richardson, M.D.  Primary Care at [facility] [address] [place], [place] [address] [phone] phone [phone] fax  12/02/17 9:01 AM

## 2017-11-29 NOTE — Patient Instructions (Addendum)
     IF you received an x-ray today, you will receive an invoice from [facility]. Please contact [facility] at [phone] with questions or concerns regarding your invoice.   IF you received labwork today, you will receive an invoice from [place]. Please contact [facility] at [phone] with questions or concerns regarding your invoice.   Our billing staff will not be able to assist you with questions regarding bills from these companies.  You will be contacted with the lab results as soon as they are available. The fastest way to get your results is to activate your My Chart account. Instructions are located on the last page of this paperwork. If you have not heard from [place] regarding the results in 2 weeks, please contact this office.      Hypothyroidism Hypothyroidism is a disorder of the thyroid. The thyroid is a large gland that is located in the lower front of the neck. The thyroid releases hormones that control how the body works. With hypothyroidism, the thyroid does not make enough of these hormones. What are the causes? Causes of hypothyroidism may include:  Viral infections.  Pregnancy.  Your own defense system (immune system) attacking your thyroid.  Certain medicines.  Birth defects.  Past radiation treatments to your head or neck.  Past treatment with radioactive iodine.  Past surgical removal of part or all of your thyroid.  Problems with the gland that is located in the center of your brain (pituitary).  What are the signs or symptoms? Signs and symptoms of hypothyroidism may include:  Feeling as though you have no energy (lethargy).  Inability to tolerate cold.  Weight gain that is not explained by a change in diet or exercise habits.  Dry skin.  Coarse hair.  Menstrual irregularity.  Slowing of thought processes.  Constipation.  Sadness or depression.  How is this diagnosed? Your health care provider may diagnose  hypothyroidism with blood tests and ultrasound tests. How is this treated? Hypothyroidism is treated with medicine that replaces the hormones that your body does not make. After you begin treatment, it may take several weeks for symptoms to go away. Follow these instructions at home:  Take medicines only as directed by your health care provider.  If you start taking any new medicines, tell your health care provider.  Keep all follow-up visits as directed by your health care provider. This is important. As your condition improves, your dosage needs may change. You will need to have blood tests regularly so that your health care provider can watch your condition. Contact a health care provider if:  Your symptoms do not get better with treatment.  You are taking thyroid replacement medicine and: ? You sweat excessively. ? You have tremors. ? You feel anxious. ? You lose weight rapidly. ? You cannot tolerate heat. ? You have emotional swings. ? You have diarrhea. ? You feel weak. Get help right away if:  You develop chest pain.  You develop an irregular heartbeat.  You develop a rapid heartbeat. This information is not intended to replace advice given to you by your health care provider. Make sure you discuss any questions you have with your health care provider. Document Released: 08/14/2005 Document Revised: 01/20/2016 Document Reviewed: 12/30/2013 Elsevier Interactive Patient Education  2018 [employer].

## 2017-12-02 DIAGNOSIS — Z789 Other specified health status: Secondary | ICD-10-CM | POA: Insufficient documentation

## 2017-12-02 DIAGNOSIS — F640 Transsexualism: Secondary | ICD-10-CM | POA: Insufficient documentation

## 2018-06-06 ENCOUNTER — Encounter: Payer: Managed Care, Other (non HMO) | Admitting: Family Medicine

## 2018-06-07 ENCOUNTER — Ambulatory Visit (INDEPENDENT_AMBULATORY_CARE_PROVIDER_SITE_OTHER): Payer: Managed Care, Other (non HMO) | Admitting: Family Medicine

## 2018-06-07 ENCOUNTER — Encounter: Payer: Self-pay | Admitting: Family Medicine

## 2018-06-07 ENCOUNTER — Other Ambulatory Visit: Payer: Self-pay

## 2018-06-07 VITALS — BP 110/72 | HR 83 | Temp 98.4°F | Resp 16 | Ht 68.0 in | Wt 183.0 lb

## 2018-06-07 DIAGNOSIS — Z5181 Encounter for therapeutic drug level monitoring: Secondary | ICD-10-CM

## 2018-06-07 DIAGNOSIS — E039 Hypothyroidism, unspecified: Secondary | ICD-10-CM

## 2018-06-07 DIAGNOSIS — Z0000 Encounter for general adult medical examination without abnormal findings: Secondary | ICD-10-CM | POA: Diagnosis not present

## 2018-06-07 DIAGNOSIS — Z789 Other specified health status: Secondary | ICD-10-CM

## 2018-06-07 DIAGNOSIS — F640 Transsexualism: Secondary | ICD-10-CM

## 2018-06-07 NOTE — Progress Notes (Signed)
10/11/20199:50 AM  Jade Richardson 1971-03-21, 47 y.o. adult [id number]  Chief Complaint  Patient presents with  . Annual Exam    with no pap    HPI:   Patient is a 47 y.o. adult with past medical history significant for M to F, hypothyroidism, anxiety who presents today for CPE  PCP Dr Brigitte Pulse Last visit April  On estrogen 19m once a day On spironolactone 200 mg once a day Has never had mammogram Does not seen Dr JMartiniqueanymore Sees eye doctor, Dr FHassell Done wears contacts Has not seen dentist for over a year Declines flu vaccine today Divorced last year Lives with her girlfriend Regular diet Does lots of walking  Fall Risk  06/07/2018 11/29/2017  Falls in the past year? No No     [facility] 06/07/2018 11/29/2017 04/18/2017  Decreased Interest 0 0 2  Down, Depressed, Hopeless 0 0 2  PHQ - 2 Score 0 0 4  Altered sleeping - - 1  Tired, decreased energy - - 1  Change in appetite - - 0  Feeling bad or failure about yourself  - - 3  Trouble concentrating - - 0  Moving slowly or fidgety/restless - - 0  Suicidal thoughts - - 1  PHQ-9 Score - - 10    No Known Allergies  Prior to Admission medications   Medication Sig Start Date End Date Taking? Authorizing Provider  estradiol (ESTRACE) 2 MG tablet Take 3 tablets (6 mg total) by mouth daily. 11/29/17  Yes SShawnee Knapp MD  levothyroxine (SYNTHROID, LEVOTHROID) 150 MCG tablet Take 1 tablet (150 mcg total) by mouth daily before breakfast. 11/29/17  Yes SShawnee Knapp MD  spironolactone (ALDACTONE) 100 MG tablet Take 2 tablets (200 mg total) by mouth daily. 11/29/17  Yes SShawnee Knapp MD    Past Medical History:  Diagnosis Date  . Asthma   . Depression   . Gender dysphoria   . Thyroid disease     Past Surgical History:  Procedure Laterality Date  . TONSILLECTOMY      Social History   Tobacco Use  . Smoking status: Never Smoker  . Smokeless tobacco: Never Used  Substance Use Topics  . Alcohol use: Yes      Comment: rarely    Family History  Problem Relation Age of Onset  . Mental illness Maternal Uncle   . Cancer Neg Hx   . Diabetes Neg Hx   . Heart disease Neg Hx   . Hypertension Neg Hx     Review of Systems  Constitutional: Negative for chills and fever.  Respiratory: Positive for cough. Negative for shortness of breath.   Cardiovascular: Negative for chest pain, palpitations and leg swelling.  Gastrointestinal: Negative for abdominal pain, nausea and vomiting.  Musculoskeletal: Negative for joint pain and myalgias.  Neurological: Negative for dizziness and headaches.  Endo/Heme/Allergies: Positive for environmental allergies.  Psychiatric/Behavioral: Negative for depression. The patient is not nervous/anxious and does not have insomnia.      OBJECTIVE:  Blood pressure 110/72, pulse 83, temperature 98.4 F (36.9 C), temperature source Oral, resp. rate 16, height 5' 8"  (1.727 m), weight 183 lb (83 kg), SpO2 96 %. Body mass index is 27.83 kg/m.    Visual Acuity Screening   Right eye Left eye Both eyes  Without correction:     With correction: 20/30 20/40 20/25     Physical Exam  Constitutional: She is oriented to person, place, and time. She appears  well-developed and well-nourished.  HENT:  Head: Normocephalic and atraumatic.  Right Ear: Hearing, tympanic membrane, external ear and ear canal normal.  Left Ear: Hearing, tympanic membrane, external ear and ear canal normal.  Mouth/Throat: Oropharynx is clear and moist. No oropharyngeal exudate.  Eyes: Pupils are equal, round, and reactive to light. Conjunctivae and EOM are normal.  Neck: Neck supple. No thyromegaly present.  Cardiovascular: Normal rate, regular rhythm, normal heart sounds and intact distal pulses. Exam reveals no gallop and no friction rub.  No murmur heard. Pulmonary/Chest: Effort normal and breath sounds normal. She has no wheezes. She has no rhonchi. She has no rales.  Abdominal: Soft. Bowel sounds  are normal. She exhibits no distension and no mass. There is no tenderness.  Musculoskeletal: Normal range of motion. She exhibits no edema.  Lymphadenopathy:    She has no cervical adenopathy.  Neurological: She is alert and oriented to person, place, and time. She has normal strength and normal reflexes. No cranial nerve deficit. Coordination and gait normal.  Skin: Skin is warm and dry.  Psychiatric: She has a normal mood and affect.  Nursing note and vitals reviewed.   ASSESSMENT and PLAN  1. Annual physical exam No concerns per history or exam. Routine HCM labs ordered. HCM reviewed/discussed. Anticipatory guidance regarding healthy weight, lifestyle and choices given.   2. Female-to-female transgender person - Estradiol - Testosterone, Free, Total, SHBG  3. Hypothyroidism, unspecified type - Lipid panel - TSH  4. Medication monitoring encounter - Lipid panel - CBC - Comprehensive metabolic panel - Estradiol - Testosterone, Free, Total, SHBG   Return in about 6 months (around 12/07/2018) for Dr Brigitte Pulse, chronic medical conditions.    Rutherford Guys, MD Primary Care at [facility] [place], [place] [address] Ph.  [phone] Fax [phone]

## 2018-06-07 NOTE — Patient Instructions (Addendum)
If you have lab work done today you will be contacted with your lab results within the next 2 weeks.  If you have not heard from [place] then please contact us. The fastest way to get your results is to register for My Chart.   IF you received an x-ray today, you will receive an invoice from [facility]. Please contact [facility] at [phone] with questions or concerns regarding your invoice.   IF you received labwork today, you will receive an invoice from [place]. Please contact [facility] at [phone] with questions or concerns regarding your invoice.   Our billing staff will not be able to assist you with questions regarding bills from these companies.  You will be contacted with the lab results as soon as they are available. The fastest way to get your results is to activate your My Chart account. Instructions are located on the last page of this paperwork. If you have not heard from [place] regarding the results in 2 weeks, please contact this office.      Preventive Care 40-64 Years, Female Preventive care refers to lifestyle choices and visits with your health care provider that can promote health and wellness. What does preventive care include?  A yearly physical exam. This is also called an annual well check.  Dental exams once or twice a year.  Routine eye exams. Ask your health care provider how often you should have your eyes checked.  Personal lifestyle choices, including: ? Daily care of your teeth and gums. ? Regular physical activity. ? Eating a healthy diet. ? Avoiding tobacco and drug use. ? Limiting alcohol use. ? Practicing safe sex. ? Taking low-dose aspirin daily starting at age 48. ? Taking vitamin and mineral supplements as recommended by your health care provider. What happens during an annual well check? The services and screenings done by your health care provider during your annual well check will depend on your age, overall health,  lifestyle risk factors, and family history of disease. Counseling Your health care provider may ask you questions about your:  Alcohol use.  Tobacco use.  Drug use.  Emotional well-being.  Home and relationship well-being.  Sexual activity.  Eating habits.  Work and work Statistician.  Method of birth control.  Menstrual cycle.  Pregnancy history.  Screening You may have the following tests or measurements:  Height, weight, and BMI.  Blood pressure.  Lipid and cholesterol levels. These may be checked every 5 years, or more frequently if you are over 11 years old.  Skin check.  Lung cancer screening. You may have this screening every year starting at age 44 if you have a 30-pack-year history of smoking and currently smoke or have quit within the past 15 years.  Fecal occult blood test (FOBT) of the stool. You may have this test every year starting at age 41.  Flexible sigmoidoscopy or colonoscopy. You may have a sigmoidoscopy every 5 years or a colonoscopy every 10 years starting at age 23.  Hepatitis C blood test.  Hepatitis B blood test.  Sexually transmitted disease (STD) testing.  Diabetes screening. This is done by checking your blood sugar (glucose) after you have not eaten for a while (fasting). You may have this done every 1-3 years.  Mammogram. This may be done every 1-2 years. Talk to your health care provider about when you should start having regular mammograms. This may depend on whether you have a family history of breast cancer.  BRCA-related cancer  screening. This may be done if you have a family history of breast, ovarian, tubal, or peritoneal cancers.  Pelvic exam and Pap test. This may be done every 3 years starting at age 35. Starting at age 38, this may be done every 5 years if you have a Pap test in combination with an HPV test.  Bone density scan. This is done to screen for osteoporosis. You may have this scan if you are at high risk for  osteoporosis.  Discuss your test results, treatment options, and if necessary, the need for more tests with your health care provider. Vaccines Your health care provider may recommend certain vaccines, such as:  Influenza vaccine. This is recommended every year.  Tetanus, diphtheria, and acellular pertussis (Tdap, Td) vaccine. You may need a Td booster every 10 years.  Varicella vaccine. You may need this if you have not been vaccinated.  Zoster vaccine. You may need this after age 72.  Measles, mumps, and rubella (MMR) vaccine. You may need at least one dose of MMR if you were born in 1957 or later. You may also need a second dose.  Pneumococcal 13-valent conjugate (PCV13) vaccine. You may need this if you have certain conditions and were not previously vaccinated.  Pneumococcal polysaccharide (PPSV23) vaccine. You may need one or two doses if you smoke cigarettes or if you have certain conditions.  Meningococcal vaccine. You may need this if you have certain conditions.  Hepatitis A vaccine. You may need this if you have certain conditions or if you travel or work in places where you may be exposed to hepatitis A.  Hepatitis B vaccine. You may need this if you have certain conditions or if you travel or work in places where you may be exposed to hepatitis B.  Haemophilus influenzae type b (Hib) vaccine. You may need this if you have certain conditions.  Talk to your health care provider about which screenings and vaccines you need and how often you need them. This information is not intended to replace advice given to you by your health care provider. Make sure you discuss any questions you have with your health care provider. Document Released: 09/10/2015 Document Revised: 05/03/2016 Document Reviewed: 06/15/2015 Elsevier Interactive Patient Education  [employer].

## 2018-06-09 LAB — COMPREHENSIVE METABOLIC PANEL
ALT: 7 IU/L (ref 0–32)
AST: 12 IU/L (ref 0–40)
Albumin/Globulin Ratio: 2.2 (ref 1.2–2.2)
Albumin: 4.3 g/dL (ref 3.5–5.5)
Alkaline Phosphatase: 40 IU/L (ref 39–117)
BUN/Creatinine Ratio: 20 (ref 9–23)
BUN: 17 mg/dL (ref 6–24)
Bilirubin Total: 0.3 mg/dL (ref 0.0–1.2)
CO2: 21 mmol/L (ref 20–29)
Calcium: 8.9 mg/dL (ref 8.7–10.2)
Chloride: 104 mmol/L (ref 96–106)
Creatinine, Ser: 0.83 mg/dL (ref 0.57–1.00)
GFR calc Af Amer: 98 mL/min/{1.73_m2} (ref >59)
GFR calc non Af Amer: 85 mL/min/{1.73_m2} (ref >59)
Globulin, Total: 2 g/dL (ref 1.5–4.5)
Glucose: 90 mg/dL (ref 65–99)
Potassium: 4.2 mmol/L (ref 3.5–5.2)
Sodium: 138 mmol/L (ref 134–144)
Total Protein: 6.3 g/dL (ref 6.0–8.5)

## 2018-06-09 LAB — LIPID PANEL
Chol/HDL Ratio: 4 ratio (ref 0.0–4.4)
Cholesterol, Total: 163 mg/dL (ref 100–199)
HDL: 41 mg/dL (ref >39)
LDL Calculated: 99 mg/dL (ref 0–99)
Triglycerides: 113 mg/dL (ref 0–149)
VLDL Cholesterol Cal: 23 mg/dL (ref 5–40)

## 2018-06-09 LAB — TESTOSTERONE, FREE, TOTAL, SHBG
Sex Hormone Binding: 108.8 nmol/L (ref 24.6–122.0)
Testosterone, Free: 0.4 pg/mL (ref 0.0–4.2)
Testosterone: 9 ng/dL (ref 8–48)

## 2018-06-09 LAB — CBC
Hematocrit: 38.8 % (ref 34.0–46.6)
Hemoglobin: 12.9 g/dL (ref 11.1–15.9)
MCH: 30.4 pg (ref 26.6–33.0)
MCHC: 33.2 g/dL (ref 31.5–35.7)
MCV: 92 fL (ref 79–97)
Platelets: 268 10*3/uL (ref 150–450)
RBC: 4.24 x10E6/uL (ref 3.77–5.28)
RDW: 13.1 % (ref 12.3–15.4)
WBC: 5.3 10*3/uL (ref 3.4–10.8)

## 2018-06-09 LAB — TSH: TSH: 2.3 u[IU]/mL (ref 0.450–4.500)

## 2018-06-09 LAB — ESTRADIOL: Estradiol: 45.4 pg/mL

## 2018-07-08 ENCOUNTER — Other Ambulatory Visit: Payer: Self-pay | Admitting: Family Medicine

## 2018-07-08 NOTE — Telephone Encounter (Signed)
Requested medication (s) are due for refill today: Yes  Requested medication (s) are on the active medication list: Yes  Last refill:  11/29/17  Future visit scheduled: Yes  Notes to clinic:  Unable to refill per protocol due to failed Mammogram up to date     Requested Prescriptions  Pending Prescriptions Disp Refills   estradiol (ESTRACE) 2 MG tablet [Pharmacy Med Name: ESTRADIOL 2MG TABLETS] 270 tablet 0    Sig: TAKE 3 TABLETS(6 MG) BY MOUTH DAILY     OB/GYN:  Estrogens Failed - 07/08/2018  4:13 PM      Failed - Mammogram is up-to-date per Health Maintenance      Passed - Last BP in normal range    BP Readings from Last 1 Encounters:  06/07/18 110/72         Passed - Valid encounter within last 12 months    Recent Outpatient Visits          1 month ago Annual physical exam   [facility], Lilia Argue, MD   7 months ago Other specified hypothyroidism   [facility], Laurey Arrow, MD   8 months ago Pre-syncope   [name], Malka So, MD   11 months ago Hypothyroidism, unspecified type   [facility] at [name], Malka So, MD   1 year ago Anxiety disorder, unspecified type   [facility] at [name], Malka So, MD      Future Appointments            In 5 months Brigitte Pulse Laurey Arrow, MD [facility] at [place], [facility]         Signed Prescriptions Disp Refills   levothyroxine (SYNTHROID, LEVOTHROID) 150 MCG tablet 90 tablet 3    Sig: TAKE 1 TABLET BY [place]     Endocrinology:  Hypothyroid Agents Failed - 07/08/2018  4:13 PM      Failed - TSH needs to be rechecked within 3 months after an abnormal result. Refill until TSH is due.      Passed - TSH in normal range and within 360 days    TSH  Date Value Ref Range Status  06/07/2018 2.300 0.450 - 4.500 uIU/mL Final         Passed - Valid encounter within last 12 months    Recent Outpatient Visits          1 month ago  Annual physical exam   [facility], Lilia Argue, MD   7 months ago Other specified hypothyroidism   [facility], Laurey Arrow, MD   8 months ago Pre-syncope   The[facility] at [name], Malka So, MD   11 months ago Hypothyroidism, unspecified type   The[facility] at [name], Malka So, MD   1 year ago Anxiety disorder, unspecified type   Therapist, [facility] at [name], Malka So, MD      Future Appointments            In 5 months Shawnee Knapp, MD [facility] at [place], [facility]          spironolactone (ALDACTONE) 100 MG tablet 180 tablet 1    Sig: TAKE 2 TABLETS BY MOUTH EVERY DAY     Cardiovascular: Diuretics - Aldosterone Antagonist Passed - 07/08/2018  4:13 PM      Passed - Cr in normal range and within 360 days  Creatinine, Ser  Date Value Ref Range Status  06/07/2018 0.83 0.57 - 1.00 mg/dL Final         Passed - K in normal range and within 360 days    Potassium  Date Value Ref Range Status  06/07/2018 4.2 3.5 - 5.2 mmol/L Final         Passed - Na in normal range and within 360 days    Sodium  Date Value Ref Range Status  06/07/2018 138 134 - 144 mmol/L Final         Passed - Last BP in normal range    BP Readings from Last 1 Encounters:  06/07/18 110/72         Passed - Valid encounter within last 6 months    Recent Outpatient Visits          1 month ago Annual physical exam   [facility], Lilia Argue, MD   7 months ago Other specified hypothyroidism   [facility], Laurey Arrow, MD   8 months ago Pre-syncope   Therapist[facility] at [name], Malka So, MD   11 months ago Hypothyroidism, unspecified type   The[facility]ist[facility] at [name], Malka So, MD   1 year ago Anxiety disorder, unspecified type   Therapist[facility] at [name], Malka So, MD      Future Appointments            In 5 months Brigitte Pulse, Laurey Arrow, MD Primary [facility] at [place], [facility]

## 2018-07-16 ENCOUNTER — Telehealth: Payer: Self-pay | Admitting: Family Medicine

## 2018-07-16 NOTE — Telephone Encounter (Signed)
Because she is on a relatively high dose of a blood pressure medication when she doesn't have high blood pressure, her body is less likely to be able to tolerate a sudden loss of a large blood volume - might drop blood pressure to much and could cause her to pass out or nearly, especially with standing or position changes. She can continue to donate blood in the future - just recommend skipping that a.m. Spiro dose and maybe the evening before as well. If she forgets and takes the prior evenings dose, make sure to have high protein salty meals that day and drink tons of fluids - water and powerade. Don't restart spiro until feeling normal.  Estradiol refill sent in. F/u in 6 mos as estradiol level was lower than expected w/ last labs.

## 2018-07-16 NOTE — Telephone Encounter (Signed)
Copied from [place] [phone]. Topic: General - Other >> Jul 16, 2018  2:15 PM Janace Aris A wrote: Reason for CRM: pt called in wanting a call back from Dr. Brigitte Pulse or nurse. Says she gave blood today and had a very bad reaction and almost passed out. she says it feels like she is relapsing, she wanted to know if there is any risk for donating blood with the amount of medication she is on.

## 2018-07-18 NOTE — Telephone Encounter (Signed)
Patient was advised and voiced understanding

## 2018-08-09 ENCOUNTER — Telehealth: Payer: Self-pay | Admitting: Family Medicine

## 2018-08-09 NOTE — Telephone Encounter (Signed)
mychart message sent to pt about rescheduling their apt on 12/05/18

## 2018-12-05 ENCOUNTER — Ambulatory Visit: Payer: Managed Care, Other (non HMO) | Admitting: Family Medicine

## 2020-06-29 ENCOUNTER — Ambulatory Visit (HOSPITAL_COMMUNITY): Payer: Self-pay

## 2021-03-25 ENCOUNTER — Other Ambulatory Visit (HOSPITAL_BASED_OUTPATIENT_CLINIC_OR_DEPARTMENT_OTHER): Payer: Self-pay | Admitting: *Deleted

## 2021-03-25 ENCOUNTER — Ambulatory Visit (HOSPITAL_BASED_OUTPATIENT_CLINIC_OR_DEPARTMENT_OTHER)
Admission: RE | Admit: 2021-03-25 | Discharge: 2021-03-25 | Disposition: A | Discharge status: Home / Self Care | Payer: BC Managed Care – PPO | Source: Ambulatory Visit | Attending: Surgical | Admitting: Surgical

## 2021-03-25 ENCOUNTER — Other Ambulatory Visit: Payer: Self-pay

## 2021-03-25 DIAGNOSIS — Z428 Encounter for other plastic and reconstructive surgery following medical procedure or healed injury: Secondary | ICD-10-CM | POA: Diagnosis present

## 2021-10-14 LAB — COLOGUARD: COLOGUARD: NEGATIVE

## 2022-04-03 ENCOUNTER — Emergency Department (HOSPITAL_COMMUNITY)
Admission: EM | Admit: 2022-04-03 | Discharge: 2022-04-03 | Disposition: A | Discharge status: Home / Self Care | Payer: BC Managed Care – PPO | Attending: Emergency Medicine | Admitting: Emergency Medicine

## 2022-04-03 ENCOUNTER — Encounter (HOSPITAL_COMMUNITY): Payer: Self-pay

## 2022-04-03 ENCOUNTER — Emergency Department (HOSPITAL_COMMUNITY): Payer: BC Managed Care – PPO

## 2022-04-03 ENCOUNTER — Other Ambulatory Visit: Payer: Self-pay

## 2022-04-03 DIAGNOSIS — R531 Weakness: Secondary | ICD-10-CM | POA: Insufficient documentation

## 2022-04-03 DIAGNOSIS — R5383 Other fatigue: Secondary | ICD-10-CM | POA: Diagnosis present

## 2022-04-03 LAB — CBC WITH DIFFERENTIAL/PLATELET
Abs Immature Granulocytes: 0.02 10*3/uL (ref 0.00–0.07)
Basophils Absolute: 0 10*3/uL (ref 0.0–0.1)
Basophils Relative: 1 %
Eosinophils Absolute: 0.1 10*3/uL (ref 0.0–0.5)
Eosinophils Relative: 2 %
HCT: 37.6 % (ref 36.0–46.0)
Hemoglobin: 13 g/dL (ref 12.0–15.0)
Immature Granulocytes: 0 %
Lymphocytes Relative: 21 %
Lymphs Abs: 1.4 10*3/uL (ref 0.7–4.0)
MCH: 30.1 pg (ref 26.0–34.0)
MCHC: 34.6 g/dL (ref 30.0–36.0)
MCV: 87 fL (ref 80.0–100.0)
Monocytes Absolute: 0.7 10*3/uL (ref 0.1–1.0)
Monocytes Relative: 10 %
Neutro Abs: 4.3 10*3/uL (ref 1.7–7.7)
Neutrophils Relative %: 66 %
Platelets: 217 10*3/uL (ref 150–400)
RBC: 4.32 MIL/uL (ref 3.87–5.11)
RDW: 12.8 % (ref 11.5–15.5)
WBC: 6.5 10*3/uL (ref 4.0–10.5)
nRBC: 0 % (ref 0.0–0.2)

## 2022-04-03 LAB — URINALYSIS, ROUTINE W REFLEX MICROSCOPIC
Bacteria, UA: NONE SEEN
Bilirubin Urine: NEGATIVE
Glucose, UA: NEGATIVE mg/dL
Ketones, ur: NEGATIVE mg/dL
Leukocytes,Ua: NEGATIVE
Nitrite: NEGATIVE
Protein, ur: NEGATIVE mg/dL
Specific Gravity, Urine: 1.004 — ABNORMAL LOW (ref 1.005–1.030)
pH: 7 (ref 5.0–8.0)

## 2022-04-03 LAB — COMPREHENSIVE METABOLIC PANEL
ALT: 13 U/L (ref 0–44)
AST: 15 U/L (ref 15–41)
Albumin: 3.8 g/dL (ref 3.5–5.0)
Alkaline Phosphatase: 36 U/L — ABNORMAL LOW (ref 38–126)
Anion gap: 5 (ref 5–15)
BUN: 14 mg/dL (ref 6–20)
CO2: 22 mmol/L (ref 22–32)
Calcium: 8.9 mg/dL (ref 8.9–10.3)
Chloride: 111 mmol/L (ref 98–111)
Creatinine, Ser: 0.75 mg/dL (ref 0.44–1.00)
GFR, Estimated: >60 mL/min (ref >60)
Glucose, Bld: 97 mg/dL (ref 70–99)
Potassium: 3.7 mmol/L (ref 3.5–5.1)
Sodium: 138 mmol/L (ref 135–145)
Total Bilirubin: 0.8 mg/dL (ref 0.3–1.2)
Total Protein: 6.5 g/dL (ref 6.5–8.1)

## 2022-04-03 LAB — TROPONIN I (HIGH SENSITIVITY): Troponin I (High Sensitivity): <2 ng/L (ref <18)

## 2022-04-03 LAB — MAGNESIUM: Magnesium: 2.1 mg/dL (ref 1.7–2.4)

## 2022-04-03 LAB — D-DIMER, QUANTITATIVE: D-Dimer, Quant: 0.27 ug/mL-FEU (ref 0.00–0.50)

## 2022-04-03 MED ORDER — SODIUM CHLORIDE 0.9 % IV BOLUS
1000.0000 mL | Freq: Once | INTRAVENOUS | Status: AC
  Administered 2022-04-03: 1000 mL via INTRAVENOUS

## 2022-04-03 NOTE — ED Provider Notes (Signed)
[facility]-EMERGENCY DEPT Provider Note   CSN: [id number] Arrival date & time: 04/03/22  4193     History  Chief Complaint  Patient presents with   Fatigue    Jade Richardson is a 51 y.o. adult.  The history is provided by the patient, the spouse and medical records.  Jade Richardson is a 51 y.o. adult who presents to the Emergency Department complaining of weakness and fatigue.  She presents to the emergency department accompanied by her spouse for evaluation of sudden onset weakness and fatigue that started at 3:14 AM.  Her spouse woke her up for assistance with a dressing placement and she felt like she had generalized weakness, could not stand and was about to pass out.  She had chattering of her teeth.  She went to bed at 11 PM and felt well.  No associated fever, chest pain, abdominal pain, nausea, vomiting, shortness of breath, dysuria, leg swelling or pain.   She has a history of thyroid disease and takes estradiol for a gender dysphoria.  She did have a long drive 5 hours each way about a week ago.  No personal or family history of DVT/PE.  No known family history.  No tobacco, alcohol, drugs.  No prior similar sxs.  No sick contacts.      Home Medications Prior to Admission medications   Medication Sig Start Date End Date Taking? Authorizing Provider  estradiol valerate (DELESTROGEN) 40 MG/ML injection Inject 12 mg into the muscle every 14 (fourteen) days. Every other Sunday 12/06/20  Yes [provider]  levothyroxine (SYNTHROID, LEVOTHROID) 150 MCG tablet TAKE 1 TABLET BY MOUTH EVERY DAY BEFORE BREAKFAST Patient taking differently: Take 150 mcg by mouth daily before breakfast. 07/08/18  Yes Sherren Mocha, MD      Allergies    Patient has no known allergies.    Review of Systems   Review of Systems  All other systems reviewed and are negative.   Physical Exam Updated Vital Signs BP 137/85   Pulse 82   Temp (!) 97.4 F  (36.3 C) (Oral)   Resp 14   Ht 5\' 7"  (1.702 m)   Wt 86.2 kg   SpO2 95%   BMI 29.76 kg/m  Physical Exam Vitals and nursing note reviewed.  Constitutional:      Appearance: She is well-developed.  HENT:     Head: Normocephalic and atraumatic.  Cardiovascular:     Rate and Rhythm: Normal rate and regular rhythm.     Heart sounds: No murmur heard. Pulmonary:     Effort: Pulmonary effort is normal. No respiratory distress.     Breath sounds: Normal breath sounds.  Abdominal:     Palpations: Abdomen is soft.     Tenderness: There is no abdominal tenderness. There is no guarding or rebound.  Musculoskeletal:        General: No swelling or tenderness.  Skin:    General: Skin is warm and dry.  Neurological:     Mental Status: She is alert and oriented to person, place, and time.     Comments: 5/5 strength in all four extremities.  2+ patellar reflexes bilaterally.  Sensation to light touch intact in bilateral lower extremities  Psychiatric:        Behavior: Behavior normal.     ED Results / Procedures / Treatments   Labs (all labs ordered are listed, but only abnormal results are displayed) Labs Reviewed  COMPREHENSIVE METABOLIC PANEL - Abnormal;  Notable for the following components:      Result Value   Alkaline Phosphatase 36 (*)    All other components within normal limits  CBC WITH DIFFERENTIAL/PLATELET  D-DIMER, QUANTITATIVE  MAGNESIUM  URINALYSIS, ROUTINE W REFLEX MICROSCOPIC  TROPONIN I (HIGH SENSITIVITY)    EKG EKG Interpretation  Date/Time:  Monday April 03 2022 05:21:57 EDT Ventricular Rate:  71 PR Interval:  158 QRS Duration: 100 QT Interval:  438 QTC Calculation: 476 R Axis:   -25 Text Interpretation: Sinus rhythm Borderline left axis deviation Borderline T wave abnormalities Confirmed by Tilden Fossa [phone]) on 04/03/2022 5:24:24 AM  Radiology DG Chest Port 1 View  Result Date: 04/03/2022 CLINICAL DATA:  Fatigue which generalized weakness. EXAM:  PORTABLE CHEST 1 VIEW COMPARISON:  None Available. FINDINGS: Artifact from EKG leads. Normal heart size and mediastinal contours. No acute infiltrate or edema. No effusion or pneumothorax. No acute osseous findings. IMPRESSION: No evidence of active disease. Electronically Signed   By: Tiburcio Pea M.D.   On: 04/03/2022 06:04    Procedures Procedures    Medications Ordered in ED Medications  sodium chloride 0.9 % bolus 1,000 mL (1,000 mLs Intravenous New Bag/Given 04/03/22 0532)    ED Course/ Medical Decision Making/ A&P                           Medical Decision Making Amount and/or Complexity of Data Reviewed Labs: ordered. Radiology: ordered.   Patient with history of thyroid disease on supplementation, on estrogen therapy here for evaluation of generalized weakness, fatigue that started abruptly upon waking.  No reports of increased stress, no pain.  She is neurologically intact on evaluation.  Given estrogen use a D-dimer was obtained, which is within normal limits.  Current clinical picture is not consistent with PE.  Labs without significant electrolyte abnormality or anemia.  Discussed with patient unclear source of symptoms but feel at this point in time she is stable for discharge home with outpatient follow-up and return precautions.  Presentation is not consistent with acute myelopathy, GBS, CVA.        Final Clinical Impression(s) / ED Diagnoses Final diagnoses:  Weakness  Other fatigue    Rx / DC Orders ED Discharge Orders     None         Tilden Fossa, MD 04/03/22 0700

## 2022-04-03 NOTE — Discharge Instructions (Signed)
The cause of your symptoms was not identified today.  It is important for you to follow-up with your family doctor for further evaluation and recheck.  Get rechecked sooner if you have new or concerning symptoms.

## 2022-04-03 NOTE — ED Triage Notes (Signed)
Patient woke up and began feeling fatigued, cold, shivering about an hour ago. Feeling weak all over her body especially in her legs. Described it as the same feeling of before she gives blood, and passing out.

## 2022-04-03 NOTE — ED Notes (Signed)
Pt states understanding of dc instructions, importance of follow up.  Pt denies questions or concerns upon dc. Pt declined wheelchair assistance upon dc. Pt ambulated out of ed w/ steady gait. No belongings left in room upon dc.  

## 2022-09-07 IMAGING — DX DG SINUSES 1-2V
3 series · 3 of 3 positions shown · non-contrast
Comparison: None.

CLINICAL DATA: Admission for cosmetic surgery.

EXAM:
PARANASAL SINUSES - 1-2 VIEW

[[person_name]]
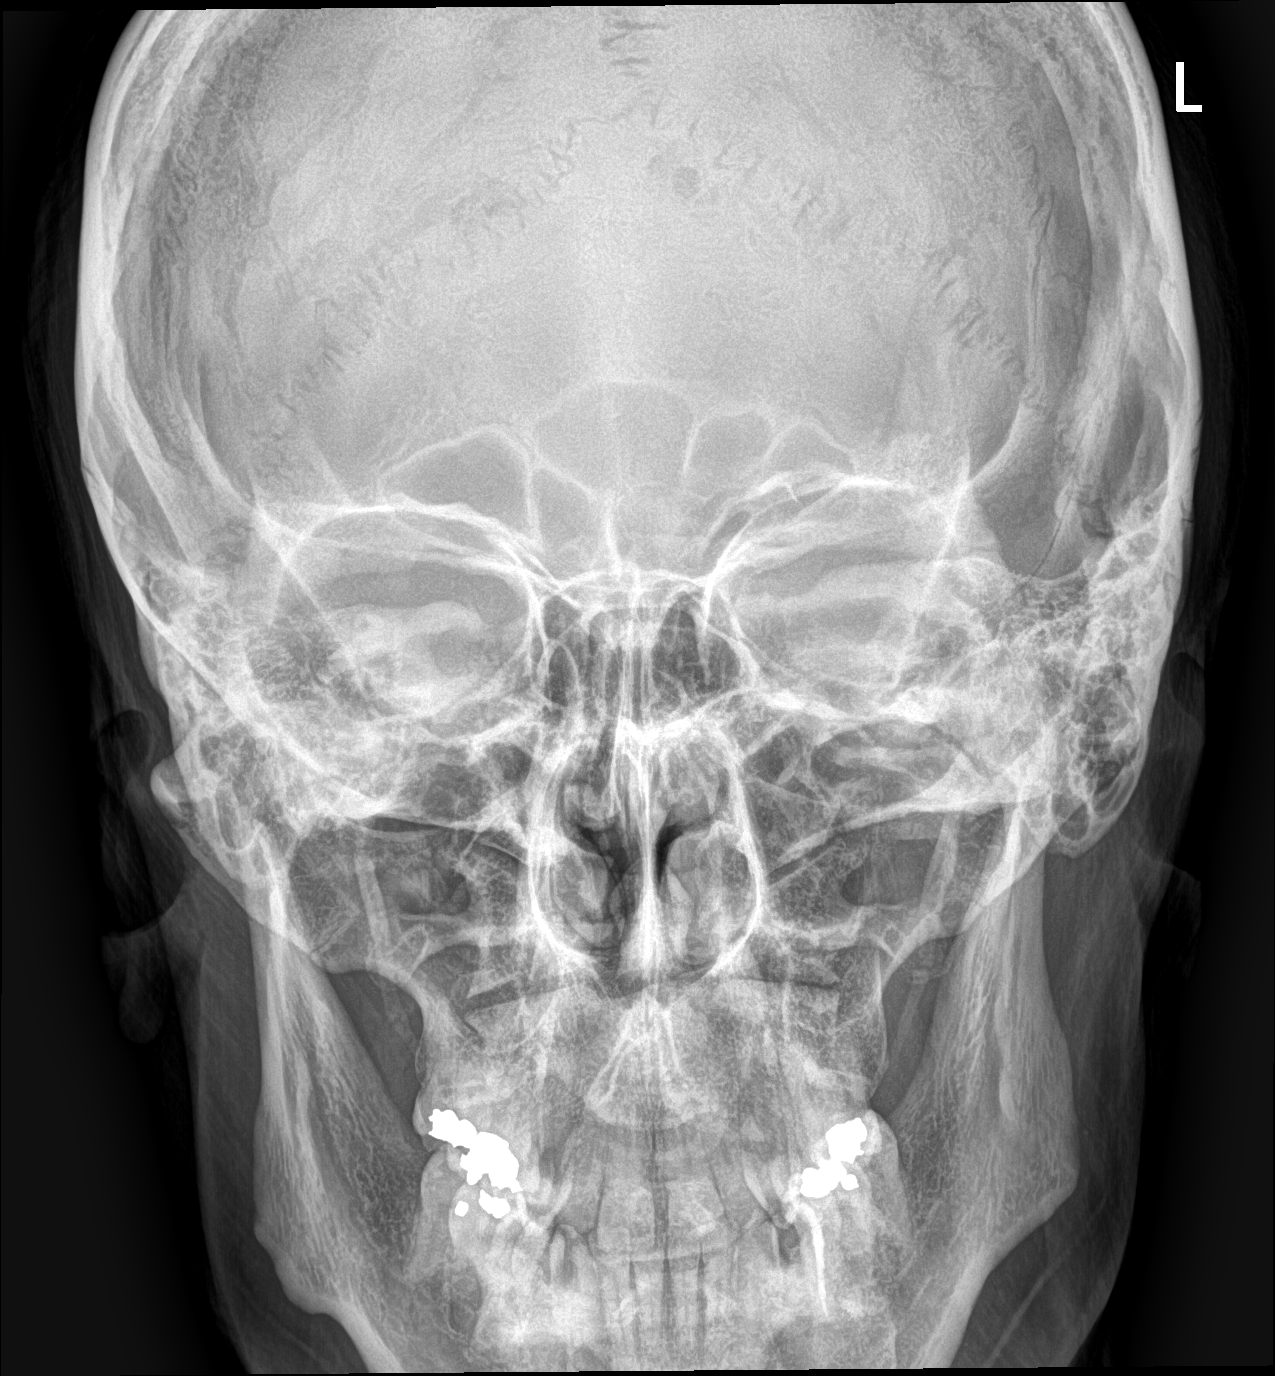

[pns waters]
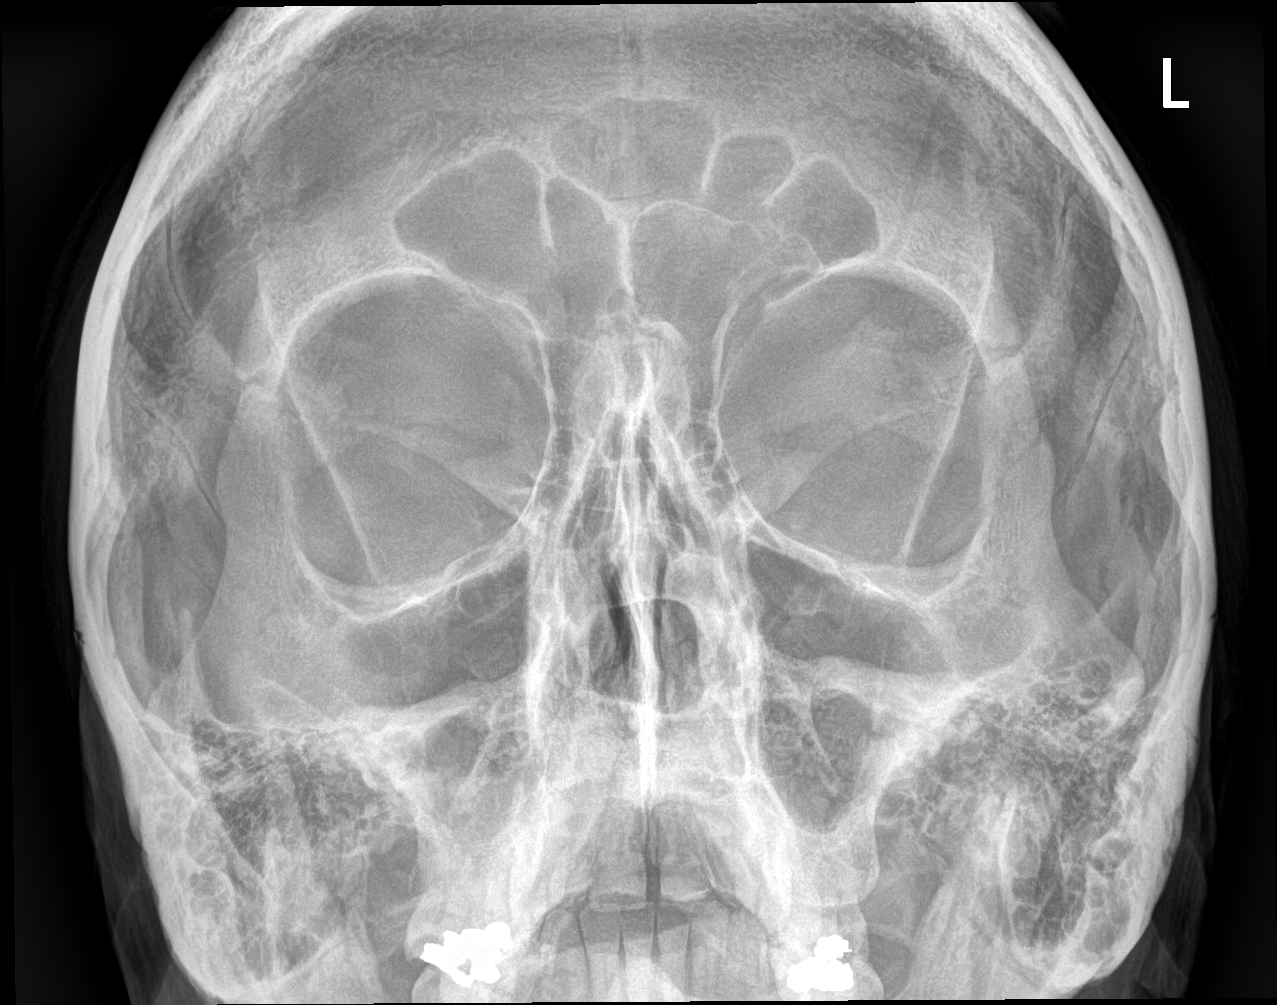

[pns lat]
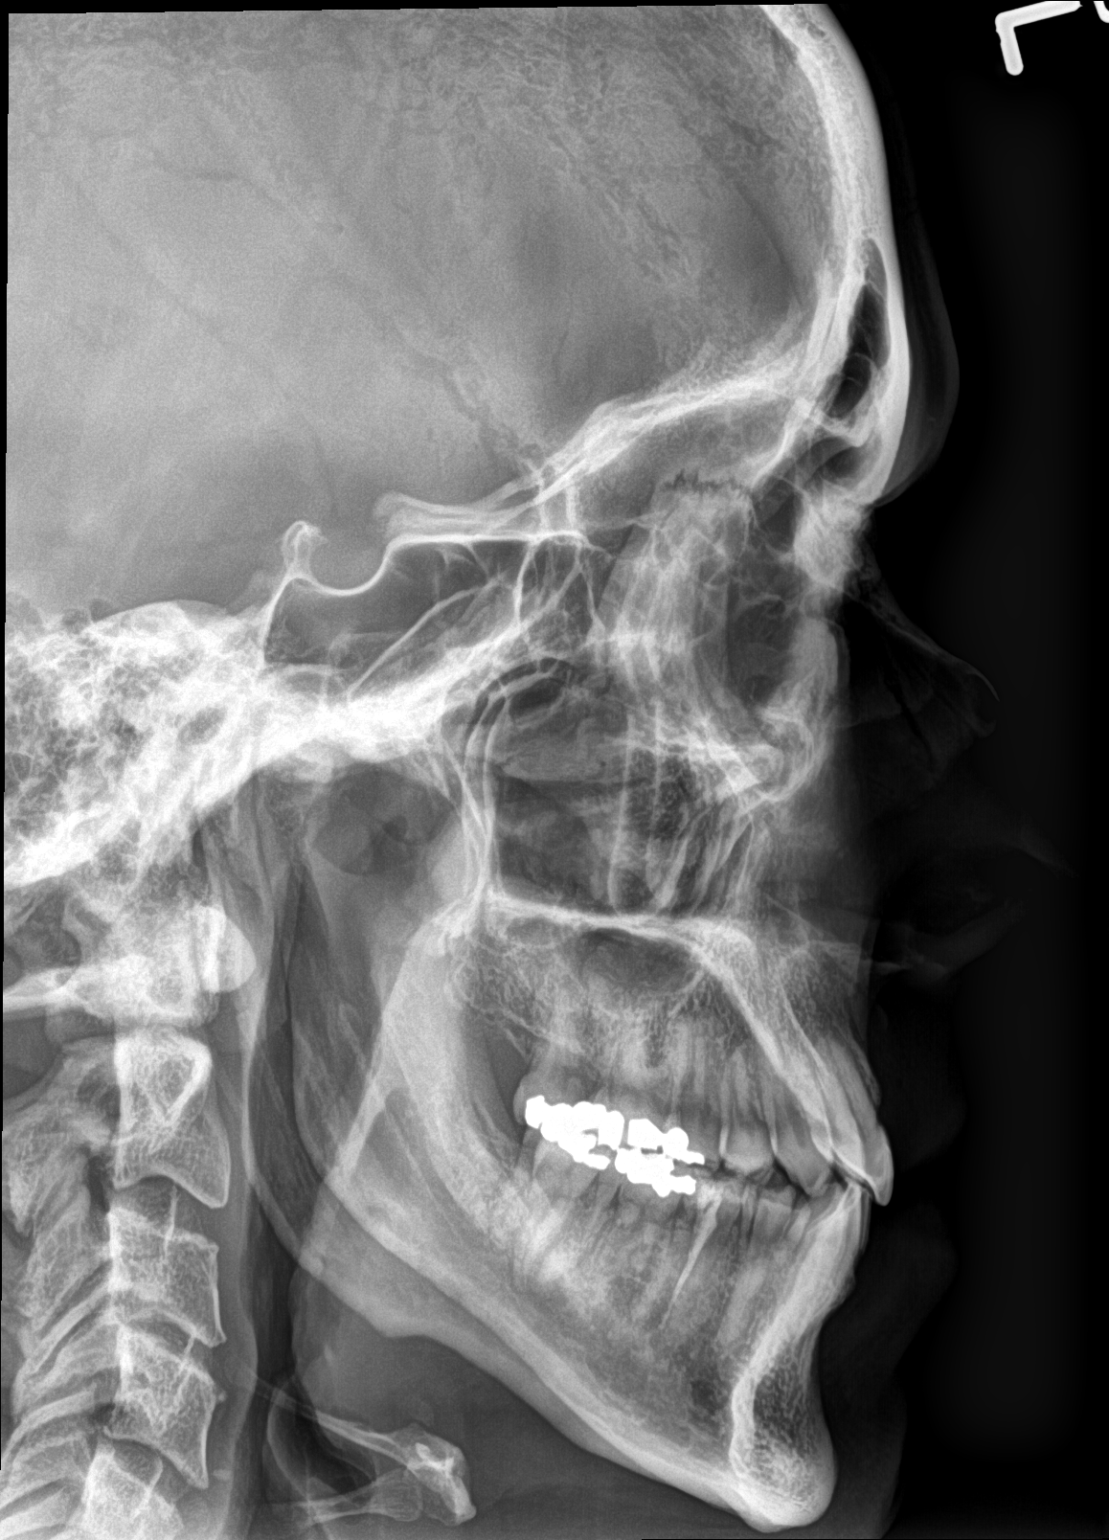

[3 of 3 positions shown; findings below may reference images not displayed]

FINDINGS: The paranasal sinus are aerated. There is no evidence of sinus
opacification air-fluid levels or mucosal thickening. No significant
bone abnormalities are seen.
IMPRESSION: Negative.

## 2024-08-26 ENCOUNTER — Ambulatory Visit: Payer: Self-pay
